# Patient Record
Sex: Female | Born: 1965 | Race: White | Hispanic: No | Smoking: Never smoker
Health system: Southern US, Community
[De-identification: ages and names within clinical notes are randomized; demographics above are authoritative.]

## PROBLEM LIST (undated history)

## (undated) DIAGNOSIS — R6883 Chills (without fever): Secondary | ICD-10-CM

## (undated) DIAGNOSIS — R61 Generalized hyperhidrosis: Secondary | ICD-10-CM

## (undated) DIAGNOSIS — R42 Dizziness and giddiness: Secondary | ICD-10-CM

## (undated) DIAGNOSIS — R319 Hematuria, unspecified: Secondary | ICD-10-CM

## (undated) DIAGNOSIS — E785 Hyperlipidemia, unspecified: Secondary | ICD-10-CM

## (undated) DIAGNOSIS — R630 Anorexia: Secondary | ICD-10-CM

## (undated) DIAGNOSIS — F419 Anxiety disorder, unspecified: Secondary | ICD-10-CM

## (undated) DIAGNOSIS — R251 Tremor, unspecified: Secondary | ICD-10-CM

## (undated) DIAGNOSIS — R0989 Other specified symptoms and signs involving the circulatory and respiratory systems: Secondary | ICD-10-CM

## (undated) DIAGNOSIS — F329 Major depressive disorder, single episode, unspecified: Secondary | ICD-10-CM

## (undated) DIAGNOSIS — R39198 Other difficulties with micturition: Secondary | ICD-10-CM

## (undated) DIAGNOSIS — K625 Hemorrhage of anus and rectum: Secondary | ICD-10-CM

## (undated) DIAGNOSIS — R52 Pain, unspecified: Secondary | ICD-10-CM

## (undated) DIAGNOSIS — F32A Depression, unspecified: Secondary | ICD-10-CM

## (undated) DIAGNOSIS — G479 Sleep disorder, unspecified: Secondary | ICD-10-CM

## (undated) DIAGNOSIS — G4730 Sleep apnea, unspecified: Secondary | ICD-10-CM

## (undated) DIAGNOSIS — K219 Gastro-esophageal reflux disease without esophagitis: Secondary | ICD-10-CM

## (undated) HISTORY — DX: Generalized hyperhidrosis: R61

## (undated) HISTORY — DX: Dizziness and giddiness: R42

## (undated) HISTORY — DX: Chills (without fever): R68.83

## (undated) HISTORY — PX: MOUTH SURGERY: SHX715

## (undated) HISTORY — DX: Other difficulties with micturition: R39.198

## (undated) HISTORY — DX: Pain, unspecified: R52

## (undated) HISTORY — PX: EYE SURGERY: SHX253

## (undated) HISTORY — DX: Depression, unspecified: F32.A

## (undated) HISTORY — DX: Hyperlipidemia, unspecified: E78.5

## (undated) HISTORY — DX: Hemorrhage of anus and rectum: K62.5

## (undated) HISTORY — DX: Other specified symptoms and signs involving the circulatory and respiratory systems: R09.89

## (undated) HISTORY — DX: Sleep disorder, unspecified: G47.9

## (undated) HISTORY — DX: Anxiety disorder, unspecified: F41.9

## (undated) HISTORY — DX: Anorexia: R63.0

## (undated) HISTORY — DX: Major depressive disorder, single episode, unspecified: F32.9

## (undated) HISTORY — DX: Hematuria, unspecified: R31.9

---

## 1999-05-14 ENCOUNTER — Other Ambulatory Visit: Admission: RE | Admit: 1999-05-14 | Discharge: 1999-05-14 | Payer: Self-pay | Admitting: Obstetrics and Gynecology

## 2000-06-16 ENCOUNTER — Other Ambulatory Visit: Admission: RE | Admit: 2000-06-16 | Discharge: 2000-06-16 | Payer: Self-pay | Admitting: Obstetrics and Gynecology

## 2001-12-28 ENCOUNTER — Encounter: Payer: Self-pay | Admitting: *Deleted

## 2001-12-28 ENCOUNTER — Ambulatory Visit (HOSPITAL_COMMUNITY): Admission: RE | Admit: 2001-12-28 | Discharge: 2001-12-28 | Payer: Self-pay | Admitting: *Deleted

## 2002-08-13 ENCOUNTER — Observation Stay (HOSPITAL_COMMUNITY): Admission: AD | Admit: 2002-08-13 | Discharge: 2002-08-13 | Payer: Self-pay | Admitting: Obstetrics and Gynecology

## 2003-02-06 ENCOUNTER — Inpatient Hospital Stay (HOSPITAL_COMMUNITY): Admission: AD | Admit: 2003-02-06 | Discharge: 2003-02-09 | Payer: Self-pay | Admitting: Obstetrics and Gynecology

## 2003-03-14 ENCOUNTER — Other Ambulatory Visit: Admission: RE | Admit: 2003-03-14 | Discharge: 2003-03-14 | Payer: Self-pay | Admitting: Obstetrics and Gynecology

## 2004-07-01 ENCOUNTER — Other Ambulatory Visit: Admission: RE | Admit: 2004-07-01 | Discharge: 2004-07-01 | Payer: Self-pay | Admitting: Obstetrics and Gynecology

## 2010-06-03 ENCOUNTER — Encounter: Admission: RE | Admit: 2010-06-03 | Discharge: 2010-06-03 | Payer: Self-pay | Admitting: Family Medicine

## 2011-05-12 ENCOUNTER — Encounter (INDEPENDENT_AMBULATORY_CARE_PROVIDER_SITE_OTHER): Payer: Self-pay | Admitting: Surgery

## 2011-05-12 ENCOUNTER — Ambulatory Visit (INDEPENDENT_AMBULATORY_CARE_PROVIDER_SITE_OTHER): Payer: BC Managed Care – PPO | Admitting: Surgery

## 2011-05-12 VITALS — BP 112/80 | HR 68 | Temp 98.6°F | Ht 67.0 in | Wt 122.0 lb

## 2011-05-12 DIAGNOSIS — K602 Anal fissure, unspecified: Secondary | ICD-10-CM

## 2011-05-12 NOTE — Patient Instructions (Signed)
Continue current treatment with sitz baths and diltiazem cream.  This should continue to get better without the need for surgery.

## 2011-05-12 NOTE — Progress Notes (Signed)
Chief Complaint  Patient presents with  . Other    new pt- eval rectal fissure    Sandra Kennedy is a 45 y.o. (DOB: 03-02-1966)  white female who is a patient of [name],[name], MD and comes to me today for anal pain/?fissure.  She saw Dr. Evette Cristal 05/05/2011 for the anal pain.  The patient first started having rectal pain and back pain on 26 April 2011. The began after her anal intercourse.  She sees Dr. Myna Bright as her primary medical doctor. Though she is a patient at [facility].  He started also rectal suppositories and some treatment of a urinary tract infection. Because of pain over the weekend she went to the urgent care at [facility], who changed her antibiotics.  Because of persistent symptoms she saw Dr. Wandalee Ferdinand on 05 May 2011. He started on her own diltiazem cream to her anus.  Over the last week she's had decreasing rectal pain, decreasing bleeding from rectum, improvement in her bowel habits.  Gastrointestinal history: She has no history of peptic ulcer disease, liver disease, pancreatic disease, or colon disease. She has no family history colon cancer, or colitis, or Crohn's disease. She's never had a colonoscopy.   Past Medical History  Diagnosis Date  . Hyperlipidemia   . Depression   . Anxiety   . Tinnitus   . Night sweats   . Chills   . Sleep difficulties   . Poor circulation   . Dizziness   . Pain     rectal  . Poor appetite   . Abdominal pain   . Rectal bleeding   . Blood in urine   . Difficulty urinating     Past Surgical History  Procedure Date  . Mouth surgery around the age of 37    Current Outpatient Prescriptions  Medication Sig Dispense Refill  . citalopram (CELEXA) 10 MG tablet daily.      Marland Kitchen DILTIAZEM HCL PO Take by mouth 4 (four) times daily.        . hydrocortisone (ANUSOL-HC) 25 MG suppository 4 times daily.      [name] 1/20 1-20 MG-MCG tablet daily.      Marland Kitchen oxycodone-acetaminophen (ROXICET) 5-500 MG per tablet Take 1 tablet by  mouth every 4 (four) hours as needed.          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  PHYSICAL EXAM: BP 112/80  Pulse 68  Temp(Src) 98.6 F (37 C) (Temporal)  Ht 5\' 7"  (1.702 m)  Wt 122 lb (55.339 kg)  BMI 19.11 kg/m2  Abdomen: She is thin. She has no abdominal scars, mass, or tenderness. Rectum: actually she has what looks like to anal fissures. One directly posterior and one his right lateral. Both show signs of healing. There is no abscess or evidence of infection.  DATA REVIEWED: Notes from Dr. Evette Cristal  ASSESSMENT and PLAN: 1.  Traumatic anal fissure.  This appears to be healing.  I gave her literature on anal fissures/rectal disease.  She'll continue her current treatment, which is going well.  Return to me on a prn basis.  She is written out of work until 05/19/2011.  That should be enough, but we can extend that if necessary.

## 2013-05-18 ENCOUNTER — Other Ambulatory Visit: Payer: Self-pay | Admitting: Neurology

## 2013-07-21 ENCOUNTER — Other Ambulatory Visit: Payer: Self-pay | Admitting: Neurology

## 2013-07-30 ENCOUNTER — Other Ambulatory Visit: Payer: Self-pay | Admitting: Family Medicine

## 2013-07-30 ENCOUNTER — Ambulatory Visit
Admission: RE | Admit: 2013-07-30 | Discharge: 2013-07-30 | Disposition: A | Discharge status: Home / Self Care | Payer: BC Managed Care – PPO | Source: Ambulatory Visit | Attending: Family Medicine | Admitting: Family Medicine

## 2013-07-30 DIAGNOSIS — R0781 Pleurodynia: Secondary | ICD-10-CM

## 2013-07-30 DIAGNOSIS — T148XXA Other injury of unspecified body region, initial encounter: Secondary | ICD-10-CM

## 2013-11-27 ENCOUNTER — Ambulatory Visit: Payer: Self-pay | Admitting: Neurology

## 2014-04-09 ENCOUNTER — Ambulatory Visit: Payer: Self-pay | Admitting: Neurology

## 2014-04-19 ENCOUNTER — Ambulatory Visit
Admission: RE | Admit: 2014-04-19 | Discharge: 2014-04-19 | Disposition: A | Discharge status: Home / Self Care | Payer: BC Managed Care – PPO | Source: Ambulatory Visit | Attending: Family Medicine | Admitting: Family Medicine

## 2014-04-19 ENCOUNTER — Other Ambulatory Visit: Payer: Self-pay | Admitting: Family Medicine

## 2014-04-19 DIAGNOSIS — R0789 Other chest pain: Secondary | ICD-10-CM

## 2014-05-31 ENCOUNTER — Ambulatory Visit: Payer: Self-pay | Admitting: Neurology

## 2014-06-11 ENCOUNTER — Ambulatory Visit: Payer: Self-pay | Admitting: Neurology

## 2014-07-23 ENCOUNTER — Institutional Professional Consult (permissible substitution): Payer: BC Managed Care – PPO | Admitting: Pulmonary Disease

## 2015-11-12 ENCOUNTER — Encounter (HOSPITAL_COMMUNITY): Payer: Self-pay

## 2015-11-12 ENCOUNTER — Emergency Department (HOSPITAL_COMMUNITY)
Admission: EM | Admit: 2015-11-12 | Discharge: 2015-11-12 | Disposition: A | Discharge status: Home / Self Care | Payer: BLUE CROSS/BLUE SHIELD | Attending: Emergency Medicine | Admitting: Emergency Medicine

## 2015-11-12 DIAGNOSIS — Z8719 Personal history of other diseases of the digestive system: Secondary | ICD-10-CM | POA: Diagnosis not present

## 2015-11-12 DIAGNOSIS — Z8669 Personal history of other diseases of the nervous system and sense organs: Secondary | ICD-10-CM | POA: Insufficient documentation

## 2015-11-12 DIAGNOSIS — Z7982 Long term (current) use of aspirin: Secondary | ICD-10-CM | POA: Insufficient documentation

## 2015-11-12 DIAGNOSIS — F419 Anxiety disorder, unspecified: Secondary | ICD-10-CM | POA: Insufficient documentation

## 2015-11-12 DIAGNOSIS — R509 Fever, unspecified: Secondary | ICD-10-CM | POA: Diagnosis present

## 2015-11-12 DIAGNOSIS — J111 Influenza due to unidentified influenza virus with other respiratory manifestations: Secondary | ICD-10-CM | POA: Diagnosis not present

## 2015-11-12 DIAGNOSIS — Z8679 Personal history of other diseases of the circulatory system: Secondary | ICD-10-CM | POA: Diagnosis not present

## 2015-11-12 DIAGNOSIS — Z79899 Other long term (current) drug therapy: Secondary | ICD-10-CM | POA: Insufficient documentation

## 2015-11-12 DIAGNOSIS — F329 Major depressive disorder, single episode, unspecified: Secondary | ICD-10-CM | POA: Diagnosis not present

## 2015-11-12 DIAGNOSIS — E785 Hyperlipidemia, unspecified: Secondary | ICD-10-CM | POA: Insufficient documentation

## 2015-11-12 LAB — CBC WITH DIFFERENTIAL/PLATELET
BASOS PCT: 0 %
Basophils Absolute: 0 10*3/uL (ref 0.0–0.1)
EOS ABS: 0 10*3/uL (ref 0.0–0.7)
Eosinophils Relative: 0 %
HEMATOCRIT: 44 % (ref 36.0–46.0)
HEMOGLOBIN: 14.6 g/dL (ref 12.0–15.0)
LYMPHS ABS: 0.8 10*3/uL (ref 0.7–4.0)
Lymphocytes Relative: 18 %
MCH: 28.4 pg (ref 26.0–34.0)
MCHC: 33.2 g/dL (ref 30.0–36.0)
MCV: 85.6 fL (ref 78.0–100.0)
Monocytes Absolute: 0.7 10*3/uL (ref 0.1–1.0)
Monocytes Relative: 16 %
NEUTROS ABS: 3 10*3/uL (ref 1.7–7.7)
NEUTROS PCT: 66 %
Platelets: 203 10*3/uL (ref 150–400)
RBC: 5.14 MIL/uL — AB (ref 3.87–5.11)
RDW: 13.1 % (ref 11.5–15.5)
WBC: 4.6 10*3/uL (ref 4.0–10.5)

## 2015-11-12 LAB — BASIC METABOLIC PANEL
ANION GAP: 14 (ref 5–15)
BUN: 13 mg/dL (ref 6–20)
CHLORIDE: 106 mmol/L (ref 101–111)
CO2: 23 mmol/L (ref 22–32)
CREATININE: 0.96 mg/dL (ref 0.44–1.00)
Calcium: 9.2 mg/dL (ref 8.9–10.3)
GFR calc non Af Amer: >60 mL/min (ref >60)
Glucose, Bld: 95 mg/dL (ref 65–99)
Potassium: 3.5 mmol/L (ref 3.5–5.1)
SODIUM: 143 mmol/L (ref 135–145)

## 2015-11-12 MED ORDER — KETOROLAC TROMETHAMINE 30 MG/ML IJ SOLN
30.0000 mg | Freq: Once | INTRAMUSCULAR | Status: AC
  Administered 2015-11-12: 30 mg via INTRAVENOUS
  Filled 2015-11-12: qty 1

## 2015-11-12 MED ORDER — SODIUM CHLORIDE 0.9 % IV BOLUS (SEPSIS)
1000.0000 mL | Freq: Once | INTRAVENOUS | Status: AC
  Administered 2015-11-12: 1000 mL via INTRAVENOUS

## 2015-11-12 NOTE — ED Provider Notes (Signed)
CSN: [id number]     Arrival date & time 11/12/15  1307 History   First MD Initiated Contact with Patient 11/12/15 1548     Chief Complaint  Patient presents with  . Fever  . Influenza     (Consider location/radiation/quality/duration/timing/severity/associated sxs/prior Treatment) HPI Comments: Patient is a 50 year old female with little medical history. She presents for evaluation of fever, cough, body aches for the past several days. She was started on Tamiflu by her primary doctor 2 days ago, however said difficulty keeping liquids down. She went to the doctor's office today and tested positive for influenza B. She was referred here for evaluation of possible dehydration, IV fluids, and further evaluation.  Patient is a 50 y.o. female presenting with fever. The history is provided by the patient.  Fever Temp source:  Subjective Severity:  Moderate Onset quality:  Sudden Duration:  3 days Timing:  Constant Progression:  Worsening Chronicity:  New Relieved by:  Nothing Worsened by:  Nothing tried Ineffective treatments:  None tried   Past Medical History  Diagnosis Date  . Hyperlipidemia   . Depression   . Anxiety   . Tinnitus   . Night sweats   . Chills   . Sleep difficulties   . Poor circulation   . Dizziness   . Pain     rectal  . Poor appetite   . Abdominal pain   . Rectal bleeding   . Blood in urine   . Difficulty urinating    Past Surgical History  Procedure Laterality Date  . Mouth surgery  around the age of 73   Family History  Problem Relation Age of Onset  . Diabetes Mother   . Diabetes Father   . Heart disease Father    Social History  Substance Use Topics  . Smoking status: Never Smoker   . Smokeless tobacco: Never Used  . Alcohol Use: No   OB History    No data available     Review of Systems  Constitutional: Positive for fever.  All other systems reviewed and are negative.     Allergies  Review of patient's allergies indicates  no known allergies.  Home Medications   Prior to Admission medications   Medication Sig Start Date End Date Taking? Authorizing Provider  aspirin 81 MG chewable tablet Chew 81 mg by mouth daily.   Yes Historical Provider, MD  Cholecalciferol (VITAMIN D PO) Take by mouth.   Yes Historical Provider, MD  citalopram (CELEXA) 10 MG tablet daily. 04/30/11  Yes Historical Provider, MD  Dextromethorphan-Menthol (DELSYM COUGH RELIEF MT) Use as directed in the mouth or throat as directed.   Yes Historical Provider, MD  ibuprofen (ADVIL,MOTRIN) 200 MG tablet Take 200 mg by mouth every 6 (six) hours as needed for fever or mild pain.   Yes Historical Provider, MD  levonorgestrel (MIRENA) 20 MCG/24HR IUD 1 each by Intrauterine route once.   Yes Historical Provider, MD  oseltamivir (TAMIFLU) 75 MG capsule Take 75 mg by mouth 2 (two) times daily. 11/10/15  Yes Historical Provider, MD  simvastatin (ZOCOR) 20 MG tablet Take 20 mg by mouth every evening. 11/01/15  Yes Historical Provider, MD   BP 145/89 mmHg  Pulse 101  Temp(Src) 99.8 F (37.7 C) (Oral)  Resp 20  SpO2 100% Physical Exam  Constitutional: She is oriented to person, place, and time. She appears well-developed and well-nourished. No distress.  HENT:  Head: Normocephalic and atraumatic.  Mucous membranes somewhat dry.  TMs clear  bilaterally.  Neck: Normal range of motion. Neck supple.  Cardiovascular: Normal rate and regular rhythm.  Exam reveals no gallop and no friction rub.   No murmur heard. Pulmonary/Chest: Effort normal and breath sounds normal. No respiratory distress. She has no wheezes.  Abdominal: Soft. Bowel sounds are normal. She exhibits no distension. There is no tenderness.  Musculoskeletal: Normal range of motion.  Neurological: She is alert and oriented to person, place, and time.  Skin: Skin is warm and dry. She is not diaphoretic.  Nursing note and vitals reviewed.   ED Course  Procedures (including critical care  time) Labs Review Labs Reviewed  BASIC METABOLIC PANEL  CBC WITH DIFFERENTIAL/PLATELET    Imaging Review No results found. I have personally reviewed and evaluated these images and lab results as part of my medical decision-making.   EKG Interpretation None      MDM   Final diagnoses:  None    Patient recently diagnosed with influenza. She continues to feel bad despite taking Tamiflu. Had difficulty eating and drinking and was sent here by her doctor for IV fluids. She was given normal saline in the ER. Her laboratory studies are essentially normal. She is to return as needed if she develops difficulty breathing or other problems.    Geoffery Lyons, MD 11/12/15 [id number]

## 2015-11-12 NOTE — ED Notes (Signed)
Pt states "I've taken 4 dose of Tamiflu.  I called the doctor's office earlier and they gave it to me.  I went to the doctor today and tested (+) for influenza B"

## 2015-11-12 NOTE — Discharge Instructions (Signed)
Continue your medications as before.  Replace fluids and get plenty of rest.  Tylenol 1000 mg rotated with Motrin 600 mg every 6 hours as needed for fever or pain.   Influenza, Adult Influenza ("the flu") is a viral infection of the respiratory tract. It occurs more often in winter months because people spend more time in close contact with one another. Influenza can make you feel very sick. Influenza easily spreads from person to person (contagious). CAUSES  Influenza is caused by a virus that infects the respiratory tract. You can catch the virus by breathing in droplets from an infected person's cough or sneeze. You can also catch the virus by touching something that was recently contaminated with the virus and then touching your mouth, nose, or eyes. RISKS AND COMPLICATIONS You may be at risk for a more severe case of influenza if you smoke cigarettes, have diabetes, have chronic heart disease (such as heart failure) or lung disease (such as asthma), or if you have a weakened immune system. Elderly people and pregnant women are also at risk for more serious infections. The most common problem of influenza is a lung infection (pneumonia). Sometimes, this problem can require emergency medical care and may be life threatening. SIGNS AND SYMPTOMS  Symptoms typically last 4 to 10 days and may include:  Fever.  Chills.  Headache, body aches, and muscle aches.  Sore throat.  Chest discomfort and cough.  Poor appetite.  Weakness or feeling tired.  Dizziness.  Nausea or vomiting. DIAGNOSIS  Diagnosis of influenza is often made based on your history and a physical exam. A nose or throat swab test can be done to confirm the diagnosis. TREATMENT  In mild cases, influenza goes away on its own. Treatment is directed at relieving symptoms. For more severe cases, your health care provider may prescribe antiviral medicines to shorten the sickness. Antibiotic medicines are not effective because  the infection is caused by a virus, not by bacteria. HOME CARE INSTRUCTIONS  Take medicines only as directed by your health care provider.  Use a cool mist humidifier to make breathing easier.  Get plenty of rest until your temperature returns to normal. This usually takes 3 to 4 days.  Drink enough fluid to keep your urine clear or pale yellow.  Cover yourmouth and nosewhen coughing or sneezing,and wash your handswellto prevent thevirusfrom spreading.  Stay homefromwork orschool untilthe fever is gonefor at least 67full day. PREVENTION  An annual influenza vaccination (flu shot) is the best way to avoid getting influenza. An annual flu shot is now routinely recommended for all adults in the [place] SEEK MEDICAL CARE IF:  You experiencechest pain, yourcough worsens,or you producemore mucus.  Youhave nausea,vomiting, ordiarrhea.  Your fever returns or gets worse. SEEK IMMEDIATE MEDICAL CARE IF:  You havetrouble breathing, you become short of breath,or your skin ornails becomebluish.  You have severe painor stiffnessin the neck.  You develop a sudden headache, or pain in the face or ear.  You have nausea or vomiting that you cannot control. MAKE SURE YOU:   Understand these instructions.  Will watch your condition.  Will get help right away if you are not doing well or get worse.   This information is not intended to replace advice given to you by your health care provider. Make sure you discuss any questions you have with your health care provider.   Document Released: 09/03/2000 Document Revised: 09/27/2014 Document Reviewed: 12/06/2011 Elsevier Interactive Patient Education [employer]

## 2015-11-12 NOTE — ED Notes (Signed)
Per EMS, pt from MD office.  Pt has had fever since Monday.  Nausea with dizziness.  Labs done at MD office.  ? Dehydration per MD office.  Vitals 140/90, hr 97, resp 18, fever 101

## 2016-04-12 DIAGNOSIS — Z01419 Encounter for gynecological examination (general) (routine) without abnormal findings: Secondary | ICD-10-CM | POA: Diagnosis not present

## 2016-04-12 DIAGNOSIS — Z1231 Encounter for screening mammogram for malignant neoplasm of breast: Secondary | ICD-10-CM | POA: Diagnosis not present

## 2016-04-12 DIAGNOSIS — Z6825 Body mass index (BMI) 25.0-25.9, adult: Secondary | ICD-10-CM | POA: Diagnosis not present

## 2016-04-30 NOTE — Discharge Instructions (Signed)
INSTRUCTIONS FOLLOWING OCULOPLASTIC SURGERY °AMY M. FOWLER, MD ° °AFTER YOUR EYE SURGERY, THER ARE MANY THINGS THWIHC YOU, THE PATIENT, CAN DO TO ASSURE THE BEST POSSIBLE RESULT FROM YOUR OPERATION.  THIS SHEET SHOULD BE REFERRED TO WHENEVER QUESTIONS ARISE.  IF THERE ARE ANY QUESTIONS NOT ANSWERED HERE, DO NOT HESITATE TO CALL OUR OFFICE AT [phone] OR [phone].  THERE IS ALWAYS [facility] AVAILABLE TO CALL IF QUESTIONS OR PROBLEMS ARISE. ° °VISION: Your vision may be blurred and out of focus after surgery until you are able to stop using your ointment, swelling resolves and your eye(s) heal. This may take 1 to 2 weeks at the least.  If your vision becomes gradually more dim or dark, this is not normal and you need to call our office immediately. ° °EYE CARE: For the first 48 hours after surgery, use ice packs frequently - “20 minutes on, 20 minutes off” - to help reduce swelling and bruising.  Small bags of frozen peas or corn make good ice packs along with cloths soaked in ice water.  If you are wearing a patch or other type of dressing following surgery, keep this on for the amount of time specified by your doctor.  For the first week following surgery, you will need to treat your stitches with great care.  If is OK to shower, but take care to not allow soapy water to run into your eye(s) to help reduce changes of infection.  You may gently clean the eyelashes and around the eye(s) with cotton balls and sterile water, BUT DO NOT RUB THE STITCHES VIGOROUSLY.  Keeping your stitches moist with ointment will help promote healing with minimal scar formation. ° °ACTIVITY: When you leave the surgery center, you should go home, rest and be inactive.  The eye(s) may feel scratchy and keeping the eyes closed will allow for faster healing.  The first week following surgery, avoid straining (anything making the face turn red) or lifting over 20 pounds.  Additionally, avoid bending which causes your head to go below  your waist.  Using your eyes will NOT harm them, so feel free to read, watch television, use the computer, etc as desired.  Driving depends on each individual, so check with your doctor if you have questions about driving. ° °MEDICATIONS:  You will be given a prescription for an ointment to use 4 times a day on your stitches.  You can use the ointment in your eyes if they feel scratchy or irritated.  If you eyelid(s) don’t close completely when you sleep, put some ointment in your eyes before bedtime. ° °EMERGENCY: If you experience SEVERE EYE PAIN OR HEADACHE UNRELIEVED BY TYLENOL OR PERCOCET, NAUSEA OR VOMITING, WORSENING REDNESS, OR WORSENING VISION (ESPECIALLY VISION THAT WA INITIALLY BETTER) CALL [phone] OR [phone] DURING BUSINESS HOURS OR AFTER HOURS. ° °General Anesthesia, Adult, Care After °Refer to this sheet in the next few weeks. These instructions provide you with information on caring for yourself after your procedure. Your health care provider may also give you more specific instructions. Your treatment has been planned according to current medical practices, but problems sometimes occur. Call your health care provider if you have any problems or questions after your procedure. °WHAT TO EXPECT AFTER THE PROCEDURE °After the procedure, it is typical to experience: °· Sleepiness. °· Nausea and vomiting. °HOME CARE INSTRUCTIONS °· For the first 24 hours after general anesthesia: °¨ Have a responsible person with you. °¨ Do not drive a car. If you   are alone, do not take public transportation. °¨ Do not drink alcohol. °¨ Do not take medicine that has not been prescribed by your health care provider. °¨ Do not sign important papers or make important decisions. °¨ You may resume a normal diet and activities as directed by your health care provider. °· Change bandages (dressings) as directed. °· If you have questions or problems that seem related to general anesthesia, call the hospital and ask for  the anesthetist or anesthesiologist on call. °SEEK MEDICAL CARE IF: °· You have nausea and vomiting that continue the day after anesthesia. °· You develop a rash. °SEEK IMMEDIATE MEDICAL CARE IF:  °· You have difficulty breathing. °· You have chest pain. °· You have any allergic problems. °  °This information is not intended to replace advice given to you by your health care provider. Make sure you discuss any questions you have with your health care provider. °  °Document Released: 12/13/2000 Document Revised: 09/27/2014 Document Reviewed: 01/05/2012 °Elsevier Interactive Patient Education ©2016 Elsevier Inc. ° °

## 2016-05-04 ENCOUNTER — Ambulatory Visit: Payer: BLUE CROSS/BLUE SHIELD | Admitting: Anesthesiology

## 2016-05-04 ENCOUNTER — Encounter
Admission: RE | Disposition: A | Discharge status: Home / Self Care | Payer: Self-pay | Source: Ambulatory Visit | Attending: Ophthalmology

## 2016-05-04 ENCOUNTER — Ambulatory Visit
Admission: RE | Admit: 2016-05-04 | Discharge: 2016-05-04 | Disposition: A | Discharge status: Home / Self Care | Payer: BLUE CROSS/BLUE SHIELD | Source: Ambulatory Visit | Attending: Ophthalmology | Admitting: Ophthalmology

## 2016-05-04 ENCOUNTER — Encounter: Payer: Self-pay | Admitting: Anesthesiology

## 2016-05-04 DIAGNOSIS — I739 Peripheral vascular disease, unspecified: Secondary | ICD-10-CM | POA: Insufficient documentation

## 2016-05-04 DIAGNOSIS — E7800 Pure hypercholesterolemia, unspecified: Secondary | ICD-10-CM | POA: Diagnosis not present

## 2016-05-04 DIAGNOSIS — F419 Anxiety disorder, unspecified: Secondary | ICD-10-CM | POA: Insufficient documentation

## 2016-05-04 DIAGNOSIS — H02832 Dermatochalasis of right lower eyelid: Secondary | ICD-10-CM | POA: Insufficient documentation

## 2016-05-04 DIAGNOSIS — H02835 Dermatochalasis of left lower eyelid: Secondary | ICD-10-CM | POA: Diagnosis not present

## 2016-05-04 DIAGNOSIS — H02105 Unspecified ectropion of left lower eyelid: Secondary | ICD-10-CM | POA: Insufficient documentation

## 2016-05-04 DIAGNOSIS — H9190 Unspecified hearing loss, unspecified ear: Secondary | ICD-10-CM | POA: Diagnosis not present

## 2016-05-04 DIAGNOSIS — H02102 Unspecified ectropion of right lower eyelid: Secondary | ICD-10-CM | POA: Diagnosis not present

## 2016-05-04 HISTORY — DX: Gastro-esophageal reflux disease without esophagitis: K21.9

## 2016-05-04 HISTORY — PX: ECTROPION REPAIR: SHX357

## 2016-05-04 SURGERY — REPAIR, ECTROPION, EYELID
Anesthesia: Monitor Anesthesia Care | Laterality: Bilateral | Wound class: Clean

## 2016-05-04 MED ORDER — OXYCODONE-ACETAMINOPHEN 5-325 MG PO TABS: 1.0000 | ORAL_TABLET | ORAL | 0 refills | Status: DC | PRN

## 2016-05-04 MED ORDER — LIDOCAINE HCL (CARDIAC) 20 MG/ML IV SOLN
INTRAVENOUS | Status: DC | PRN
  Administered 2016-05-04: 40 mg via INTRAVENOUS

## 2016-05-04 MED ORDER — PROPOFOL 500 MG/50ML IV EMUL
INTRAVENOUS | Status: DC | PRN
  Administered 2016-05-04: 50 ug/kg/min via INTRAVENOUS

## 2016-05-04 MED ORDER — ERYTHROMYCIN 5 MG/GM OP OINT: TOPICAL_OINTMENT | OPHTHALMIC | 3 refills | Status: DC

## 2016-05-04 MED ORDER — TETRACAINE HCL 0.5 % OP SOLN
OPHTHALMIC | Status: DC | PRN
  Administered 2016-05-04: 2 [drp] via OPHTHALMIC

## 2016-05-04 MED ORDER — ALFENTANIL 500 MCG/ML IJ INJ
INJECTION | INTRAMUSCULAR | Status: DC | PRN
  Administered 2016-05-04: 1000 ug via INTRAVENOUS

## 2016-05-04 MED ORDER — ERYTHROMYCIN 5 MG/GM OP OINT
TOPICAL_OINTMENT | OPHTHALMIC | Status: DC | PRN
  Administered 2016-05-04: 1 via OPHTHALMIC

## 2016-05-04 MED ORDER — LIDOCAINE-EPINEPHRINE 2 %-1:100000 IJ SOLN
INTRAMUSCULAR | Status: DC | PRN
  Administered 2016-05-04: 4 mL via OPHTHALMIC

## 2016-05-04 MED ORDER — MIDAZOLAM HCL 2 MG/2ML IJ SOLN
INTRAMUSCULAR | Status: DC | PRN
  Administered 2016-05-04: 2 mg via INTRAVENOUS

## 2016-05-04 MED ORDER — LACTATED RINGERS IV SOLN
INTRAVENOUS | Status: DC
  Administered 2016-05-04: 10:00:00 via INTRAVENOUS

## 2016-05-04 SURGICAL SUPPLY — 39 items
APPLICATOR COTTON TIP WD 3 STR (MISCELLANEOUS) ×6 IMPLANT
BLADE SURG 15 STRL LF DISP TIS (BLADE) ×1 IMPLANT
BLADE SURG 15 STRL SS (BLADE) ×3
CAUTERY HITEMP AA01 (MISCELLANEOUS) ×2 IMPLANT
CORD BIP STRL DISP 12FT (MISCELLANEOUS) ×3 IMPLANT
DRAPE HEAD BAR (DRAPES) ×3 IMPLANT
GAUZE SPONGE 4X4 12PLY STRL (GAUZE/BANDAGES/DRESSINGS) ×3 IMPLANT
GAUZE SPONGE NON-WVN 2X2 STRL (MISCELLANEOUS) ×10 IMPLANT
GLOVE SURG LX 7.0 MICRO (GLOVE) ×4
GLOVE SURG LX STRL 7.0 MICRO (GLOVE) ×2 IMPLANT
MARKER SKIN XFINE TIP W/RULER (MISCELLANEOUS) ×3 IMPLANT
NDL FILTER BLUNT 18X1 1/2 (NEEDLE) ×1 IMPLANT
NDL HYPO 30X.5 LL (NEEDLE) ×2 IMPLANT
NEEDLE FILTER BLUNT 18X 1/2SAF (NEEDLE) ×2
NEEDLE FILTER BLUNT 18X1 1/2 (NEEDLE) ×1 IMPLANT
NEEDLE HYPO 30X.5 LL (NEEDLE) ×6 IMPLANT
PACK DRAPE NASAL/ENT (PACKS) ×3 IMPLANT
SOL PREP PVP 2OZ (MISCELLANEOUS) ×3
SOLUTION PREP PVP 2OZ (MISCELLANEOUS) ×1 IMPLANT
SPONGE VERSALON 2X2 STRL (MISCELLANEOUS) ×30
SUT CHROMIC 4-0 (SUTURE)
SUT CHROMIC 4-0 M2 12X2 ARM (SUTURE)
SUT CHROMIC 5 0 P 3 (SUTURE) IMPLANT
SUT ETHILON 4 0 CL P 3 (SUTURE) IMPLANT
SUT MERSILENE 4-0 S-2 (SUTURE) ×5 IMPLANT
SUT PDS AB 4-0 P3 18 (SUTURE) IMPLANT
SUT PLAIN GUT (SUTURE) ×3 IMPLANT
SUT PROLENE 5 0 P 3 (SUTURE) IMPLANT
SUT PROLENE 6 0 P 1 18 (SUTURE) IMPLANT
SUT SILK 4 0 G 3 (SUTURE) IMPLANT
SUT VIC AB 5-0 P-3 18X BRD (SUTURE) IMPLANT
SUT VIC AB 5-0 P3 18 (SUTURE)
SUT VICRYL 6-0  S14 CTD (SUTURE)
SUT VICRYL 6-0 S14 CTD (SUTURE) IMPLANT
SUT VICRYL 7 0 TG140 8 (SUTURE) IMPLANT
SUTURE CHRMC 4-0 M2 12X2 ARM (SUTURE) IMPLANT
SYR 3ML LL SCALE MARK (SYRINGE) ×5 IMPLANT
SYRINGE 10CC LL (SYRINGE) ×3 IMPLANT
WATER STERILE IRR 500ML POUR (IV SOLUTION) ×3 IMPLANT

## 2016-05-04 NOTE — Interval H&P Note (Signed)
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 After consideration of risks, benefits and other options for treatment, the patient has consented to  Procedure(s): REPAIR OF ECTROPION WITH CONJUNCTIVOPLASTY (Bilateral) as a surgical intervention .  The patient's history has been reviewed, patient examined, no change in status, stable for surgery.  I have reviewed the patient's chart and labs.  Questions were answered to the patient's satisfaction.     Ether GriffinsFowler, Rashay Barnette M

## 2016-05-04 NOTE — Anesthesia Procedure Notes (Signed)
Procedure Name: MAC Performed by: Ajahnae Rathgeber Pre-anesthesia Checklist: Patient identified, Emergency Drugs available, Suction available, Timeout performed and Patient being monitored Patient Re-evaluated:Patient Re-evaluated prior to inductionOxygen Delivery Method: Nasal cannula Placement Confirmation: positive ETCO2     

## 2016-05-04 NOTE — H&P (Signed)
  See the history and physical completed at [facility] on 04/21/16 and scanned into the chart.  

## 2016-05-04 NOTE — Transfer of Care (Signed)
Immediate Anesthesia Transfer of Care Note  Patient: Sandra RothmanSherri S Kennedy  Procedure(s) Performed: Procedure(s): REPAIR OF ECTROPION WITH CONJUNCTIVOPLASTY (Bilateral)  Patient Location: PACU  Anesthesia Type: MAC  Level of Consciousness: awake, alert  and patient cooperative  Airway and Oxygen Therapy: Patient Spontanous Breathing and Patient connected to supplemental oxygen  Post-op Assessment: Post-op Vital signs reviewed, Patient's Cardiovascular Status Stable, Respiratory Function Stable, Patent Airway and No signs of Nausea or vomiting  Post-op Vital Signs: Reviewed and stable  Complications: No apparent anesthesia complications

## 2016-05-04 NOTE — Op Note (Signed)
Preoperative Diagnosis:   1.  Lower eyelid laxity with ectropion, bilateral  lower eyelid(s). 2.  Bilateral conjunctival chalasis   Postoperative Diagnosis:   Same.  Procedure(s) Performed:  1.  Lateral tarsal strip procedure,  both  lower eyelid(s). 2.  Bilateral conjunctivoplasty   Teaching Surgeon: Hubbard RobinsonAmy M. Ether GriffinsFowler, M.D.  Assistants: none  Anesthesia: MAC  Specimens: None.  Estimated Blood Loss: Minimal.  Complications: None.  Operative Findings: None   Procedure:    Review of patient's allergies indicates no known allergies..    After discussing the risks, benefits, complications, and alternatives with the patient, appropriate informed consent was obtained. The patient was brought to the operating suite and reclined supine. Time out was conducted and the patient was sedated.  Local anesthetic consisting of a 50-50 mixture of 2% lidocaine with epinephrine and 0.75% bupivacaine was injected subcutaneously to the both lateral canthal region(s) and lower eyelid(s). Additional anesthetic was injected subconjunctivally to the bilateral lower eyelid(s). Finally, anesthetic was injected down to the periosteum of the bilateral lateral orbital rim(s).  After adequate local was instilled, the patient was prepped and draped in the usual sterile fashion for eyelid surgery.   A punctal dilator was used to dilate all 4 puncta I. The upper lid puncta I were very stenotic. A #1 Bowman probe was then passed through the upper and lower canalicular system without difficulty. Fluorescein solution was injected through the lower canaliculas and retrieved under the inferior turbinate with a cotton swab on both sides which demonstrated a degree of patency of the nasolacrimal ducts. There was reflux with the irrigation of the fluorescein fluid left side worse than right which may indicate a partial nasolacrimal duct obstruction  bilaterally.  Attention was turned to the left lateral canthal angle.  Westcott scissors were used to create a lateral canthotomy. Hemostasis was obtained with bipolar cautery. An inferior cantholysis was then performed with additional bipolar hemostasis. The anterior and posterior lamella of the lid were divided for approximately 8 mm.  A strip of the epithelium was excised off the superior margin of the tarsal strip and conjunctiva and retractors were incised off the inferior margin of the tarsal strip. Additional local anesthetic was injected subconjunctivally to the medial bulbar conjunctiva to elevated. A high-temperature hand-held cautery was used to create multiple thermal burns in the medial bulbar conjunctiva to contract the tissue.  A double-armed 4-0 Mersilene suture was then passed each arm through the terminal portion of the tarsal strip. Each arm of the suture was then passed through the periosteum of the inner portion of the lateral orbital rim at the level of Whitnall's tubercle. The sutures were advanced and this provided nice elevation and tightening of the lower eyelid. Once the suture was secured, a thin strip of follicle-bearing skin was excised. The lateral canthal angle was reformed with an interrupted 6-0 fast absorbing plain suture. Orbicularis was reapproximated with horizontal subcuticular 6-0 fast absorbing plain gut sutures. The skin was closed with interrupted 6-0 fast absorbing plain gut sutures.   Attention was then turned to the opposite eyelid where the same procedure was performed in the same manner.   The patient tolerated the procedure well. Erythromycin ophthalmic ointment was applied to the incision site(s) followed by ice packs. The patient was taken to the recovery area where she recovered without difficulty.  Post-Op Plan/Instructions:  The patient was instructed to use ice packs frequently for the next 48 hours. She was instructed to use erythromycin ophthalmic ointment on her incisions  4 times a day for the next 12 to 14 days. She  was given a prescription for Percocet for pain control should Tylenol not be effective. She was asked to to follow up at the [facility] [place] in 2 weeks' time or sooner as needed for problems.  Teaching Surgeon Attestation: None  Sandra Kennedy M. Ether GriffinsFowler, M.D. Attending,Ophthalmology

## 2016-05-04 NOTE — Anesthesia Postprocedure Evaluation (Signed)
Anesthesia Post Note  Patient: Sandra RothmanSherri S Lachapelle  Procedure(s) Performed: Procedure(s) (LRB): REPAIR OF ECTROPION WITH CONJUNCTIVOPLASTY (Bilateral)  Patient location during evaluation: PACU Anesthesia Type: General Level of consciousness: awake and alert Pain management: pain level controlled Vital Signs Assessment: post-procedure vital signs reviewed and stable Respiratory status: spontaneous breathing, nonlabored ventilation, respiratory function stable and patient connected to nasal cannula oxygen Cardiovascular status: blood pressure returned to baseline and stable Postop Assessment: no signs of nausea or vomiting Anesthetic complications: no    Dorene GrebeMcCulloch, Katrece Roediger V

## 2016-05-04 NOTE — Anesthesia Preprocedure Evaluation (Signed)
Anesthesia Evaluation  Patient identified by MRN, date of birth, ID band Patient awake    Airway Mallampati: II  TM Distance: >3 FB Neck ROM: Full    Dental   Pulmonary    Pulmonary exam normal        Cardiovascular + Peripheral Vascular Disease  Normal cardiovascular exam     Neuro/Psych Anxiety Depression    GI/Hepatic   Endo/Other    Renal/GU      Musculoskeletal   Abdominal   Peds  Hematology   Anesthesia Other Findings   Reproductive/Obstetrics                             Anesthesia Physical Anesthesia Plan  ASA: II  Anesthesia Plan: MAC   Post-op Pain Management:    Induction: Intravenous  Airway Management Planned:   Additional Equipment:   Intra-op Plan:   Post-operative Plan:   Informed Consent: I have reviewed the patients History and Physical, chart, labs and discussed the procedure including the risks, benefits and alternatives for the proposed anesthesia with the patient or authorized representative who has indicated his/her understanding and acceptance.     Plan Discussed with: CRNA  Anesthesia Plan Comments:         Anesthesia Quick Evaluation

## 2016-05-05 ENCOUNTER — Encounter: Payer: Self-pay | Admitting: Ophthalmology

## 2016-07-21 DIAGNOSIS — H524 Presbyopia: Secondary | ICD-10-CM | POA: Diagnosis not present

## 2017-04-13 DIAGNOSIS — Z01419 Encounter for gynecological examination (general) (routine) without abnormal findings: Secondary | ICD-10-CM | POA: Diagnosis not present

## 2017-04-13 DIAGNOSIS — Z01411 Encounter for gynecological examination (general) (routine) with abnormal findings: Secondary | ICD-10-CM | POA: Diagnosis not present

## 2017-04-13 DIAGNOSIS — Z1231 Encounter for screening mammogram for malignant neoplasm of breast: Secondary | ICD-10-CM | POA: Diagnosis not present

## 2017-04-13 DIAGNOSIS — Z13220 Encounter for screening for lipoid disorders: Secondary | ICD-10-CM | POA: Diagnosis not present

## 2017-04-13 DIAGNOSIS — Z6826 Body mass index (BMI) 26.0-26.9, adult: Secondary | ICD-10-CM | POA: Diagnosis not present

## 2017-04-13 DIAGNOSIS — F419 Anxiety disorder, unspecified: Secondary | ICD-10-CM | POA: Diagnosis not present

## 2017-04-13 DIAGNOSIS — Z13228 Encounter for screening for other metabolic disorders: Secondary | ICD-10-CM | POA: Diagnosis not present

## 2017-04-15 ENCOUNTER — Other Ambulatory Visit: Payer: Self-pay | Admitting: Obstetrics and Gynecology

## 2017-04-15 DIAGNOSIS — R928 Other abnormal and inconclusive findings on diagnostic imaging of breast: Secondary | ICD-10-CM

## 2017-04-21 ENCOUNTER — Ambulatory Visit
Admission: RE | Admit: 2017-04-21 | Discharge: 2017-04-21 | Disposition: A | Discharge status: Home / Self Care | Payer: BLUE CROSS/BLUE SHIELD | Source: Ambulatory Visit | Attending: Obstetrics and Gynecology | Admitting: Obstetrics and Gynecology

## 2017-04-21 ENCOUNTER — Ambulatory Visit: Payer: BLUE CROSS/BLUE SHIELD

## 2017-04-21 DIAGNOSIS — R928 Other abnormal and inconclusive findings on diagnostic imaging of breast: Secondary | ICD-10-CM

## 2017-05-12 DIAGNOSIS — H9313 Tinnitus, bilateral: Secondary | ICD-10-CM | POA: Diagnosis not present

## 2017-05-12 DIAGNOSIS — H6123 Impacted cerumen, bilateral: Secondary | ICD-10-CM | POA: Diagnosis not present

## 2017-05-12 DIAGNOSIS — J343 Hypertrophy of nasal turbinates: Secondary | ICD-10-CM | POA: Diagnosis not present

## 2017-05-12 DIAGNOSIS — R0683 Snoring: Secondary | ICD-10-CM | POA: Diagnosis not present

## 2017-05-17 ENCOUNTER — Other Ambulatory Visit (HOSPITAL_BASED_OUTPATIENT_CLINIC_OR_DEPARTMENT_OTHER): Payer: Self-pay

## 2017-05-17 DIAGNOSIS — G4730 Sleep apnea, unspecified: Secondary | ICD-10-CM

## 2017-05-17 DIAGNOSIS — R0683 Snoring: Secondary | ICD-10-CM

## 2017-05-30 ENCOUNTER — Ambulatory Visit (HOSPITAL_BASED_OUTPATIENT_CLINIC_OR_DEPARTMENT_OTHER): Payer: BLUE CROSS/BLUE SHIELD | Attending: Otolaryngology | Admitting: Internal Medicine

## 2017-05-30 DIAGNOSIS — G4730 Sleep apnea, unspecified: Secondary | ICD-10-CM | POA: Diagnosis not present

## 2017-05-30 DIAGNOSIS — R0683 Snoring: Secondary | ICD-10-CM | POA: Insufficient documentation

## 2017-06-04 DIAGNOSIS — R0683 Snoring: Secondary | ICD-10-CM

## 2017-06-04 NOTE — Procedures (Signed)
  Patient Name: Sandra Kennedy, Sandra Kennedy Date: 05/31/2017 Gender: Female D.O.B: [birth year] Age (years): 51 Referring Provider: Christia Reading Height (inches): 67 Interpreting Physician: Jetty Duhamel MD, ABSM Weight (lbs): 157 RPSGT: Vinton Sink BMI: 25 MRN: [id number] Neck Size: 14.00 CLINICAL INFORMATION Sleep Study Type: HST  Indication for sleep study: Snoring  Epworth Sleepiness Score: 6  SLEEP STUDY TECHNIQUE A multi-channel overnight portable sleep study was performed. The channels recorded were: nasal airflow, thoracic respiratory movement, and oxygen saturation with a pulse oximetry. Snoring was also monitored.  MEDICATIONS Patient self administered medications include: none reported.  SLEEP ARCHITECTURE Patient was studied for 393.0 minutes. The sleep efficiency was 100.0 % and the patient was supine for 70.1%. The arousal index was 0.0 per hour.  RESPIRATORY PARAMETERS The overall AHI was 56.9 per hour, with a central apnea index of 0.0 per hour.  The oxygen nadir was 68% during sleep.  CARDIAC DATA Mean heart rate during sleep was 71.1 bpm.  IMPRESSIONS - Severe obstructive sleep apnea occurred during this study (AHI = 56.9/h). - No significant central sleep apnea occurred during this study (CAI = 0.0/h). - Severe oxygen desaturation was noted during this study (Min O2 = 68%, Mean 93%). - Patient snored.  DIAGNOSIS - Obstructive Sleep Apnea (327.23 [G47.33 ICD-10]) - Nocturnal Hypoxemia (327.26 [G47.36 ICD-10])  RECOMMENDATIONS - CPAP titration is usually first choice with scores in this range. Other options would be based on clinical judgment. - Positional therapy avoiding supine position during sleep. - Be careful with alcohol, sedatives and other CNS depressants that may worsen sleep apnea and disrupt normal sleep architecture. - Sleep hygiene should be reviewed to assess factors that may improve sleep quality. - Weight management and regular  exercise should be initiated or continued.  [Electronically signed] 06/04/2017 11:07 AM  Jetty Duhamel MD, ABSM Diplomate, American Board of Sleep Medicine   NPI: [id number]  Waymon Budge Diplomate, American Board of Sleep Medicine  ELECTRONICALLY SIGNED ON:  06/04/2017, 11:04 AM [facility] PH: [phone]   FX: [phone] ACCREDITED BY THE AMERICAN ACADEMY OF SLEEP MEDICINE

## 2017-06-08 DIAGNOSIS — Z1321 Encounter for screening for nutritional disorder: Secondary | ICD-10-CM | POA: Diagnosis not present

## 2017-06-08 DIAGNOSIS — R799 Abnormal finding of blood chemistry, unspecified: Secondary | ICD-10-CM | POA: Diagnosis not present

## 2017-06-23 DIAGNOSIS — Z13820 Encounter for screening for osteoporosis: Secondary | ICD-10-CM | POA: Diagnosis not present

## 2017-06-28 DIAGNOSIS — G4733 Obstructive sleep apnea (adult) (pediatric): Secondary | ICD-10-CM | POA: Diagnosis not present

## 2017-07-11 DIAGNOSIS — H903 Sensorineural hearing loss, bilateral: Secondary | ICD-10-CM | POA: Diagnosis not present

## 2017-07-29 DIAGNOSIS — G4733 Obstructive sleep apnea (adult) (pediatric): Secondary | ICD-10-CM | POA: Diagnosis not present

## 2017-08-01 DIAGNOSIS — Z9989 Dependence on other enabling machines and devices: Secondary | ICD-10-CM | POA: Diagnosis not present

## 2017-08-01 DIAGNOSIS — G4733 Obstructive sleep apnea (adult) (pediatric): Secondary | ICD-10-CM | POA: Diagnosis not present

## 2017-08-28 DIAGNOSIS — G4733 Obstructive sleep apnea (adult) (pediatric): Secondary | ICD-10-CM | POA: Diagnosis not present

## 2017-09-07 DIAGNOSIS — G4733 Obstructive sleep apnea (adult) (pediatric): Secondary | ICD-10-CM | POA: Diagnosis not present

## 2017-09-28 DIAGNOSIS — G4733 Obstructive sleep apnea (adult) (pediatric): Secondary | ICD-10-CM | POA: Diagnosis not present

## 2017-10-17 DIAGNOSIS — H6123 Impacted cerumen, bilateral: Secondary | ICD-10-CM | POA: Diagnosis not present

## 2017-10-29 DIAGNOSIS — G4733 Obstructive sleep apnea (adult) (pediatric): Secondary | ICD-10-CM | POA: Diagnosis not present

## 2017-11-14 DIAGNOSIS — L814 Other melanin hyperpigmentation: Secondary | ICD-10-CM | POA: Diagnosis not present

## 2017-11-14 DIAGNOSIS — D225 Melanocytic nevi of trunk: Secondary | ICD-10-CM | POA: Diagnosis not present

## 2017-11-14 DIAGNOSIS — D1801 Hemangioma of skin and subcutaneous tissue: Secondary | ICD-10-CM | POA: Diagnosis not present

## 2017-11-14 DIAGNOSIS — L821 Other seborrheic keratosis: Secondary | ICD-10-CM | POA: Diagnosis not present

## 2017-11-26 DIAGNOSIS — G4733 Obstructive sleep apnea (adult) (pediatric): Secondary | ICD-10-CM | POA: Diagnosis not present

## 2017-12-16 DIAGNOSIS — J101 Influenza due to other identified influenza virus with other respiratory manifestations: Secondary | ICD-10-CM | POA: Diagnosis not present

## 2017-12-16 DIAGNOSIS — R110 Nausea: Secondary | ICD-10-CM | POA: Diagnosis not present

## 2017-12-27 DIAGNOSIS — G4733 Obstructive sleep apnea (adult) (pediatric): Secondary | ICD-10-CM | POA: Diagnosis not present

## 2018-01-10 DIAGNOSIS — E785 Hyperlipidemia, unspecified: Secondary | ICD-10-CM | POA: Diagnosis not present

## 2018-01-20 DIAGNOSIS — J029 Acute pharyngitis, unspecified: Secondary | ICD-10-CM | POA: Diagnosis not present

## 2018-01-20 DIAGNOSIS — J069 Acute upper respiratory infection, unspecified: Secondary | ICD-10-CM | POA: Diagnosis not present

## 2018-01-23 DIAGNOSIS — J069 Acute upper respiratory infection, unspecified: Secondary | ICD-10-CM | POA: Diagnosis not present

## 2018-01-23 DIAGNOSIS — R05 Cough: Secondary | ICD-10-CM | POA: Diagnosis not present

## 2018-01-26 DIAGNOSIS — G4733 Obstructive sleep apnea (adult) (pediatric): Secondary | ICD-10-CM | POA: Diagnosis not present

## 2018-01-27 DIAGNOSIS — G4733 Obstructive sleep apnea (adult) (pediatric): Secondary | ICD-10-CM | POA: Diagnosis not present

## 2018-01-27 DIAGNOSIS — J209 Acute bronchitis, unspecified: Secondary | ICD-10-CM | POA: Diagnosis not present

## 2018-02-08 DIAGNOSIS — G4733 Obstructive sleep apnea (adult) (pediatric): Secondary | ICD-10-CM | POA: Diagnosis not present

## 2018-06-27 IMAGING — MG 2D DIGITAL DIAGNOSTIC UNILATERAL LEFT MAMMOGRAM WITH CAD AND ADJ
9 series · 9 of 21 positions shown · non-contrast
Comparison: Previous exam(s).

CLINICAL DATA: 51-year-old female, callback from screening
mammogram for possible left breast asymmetry

EXAM:
2D DIGITAL DIAGNOSTIC UNILATERAL LEFT MAMMOGRAM WITH CAD AND ADJUNCT
TOMO

[L ML]
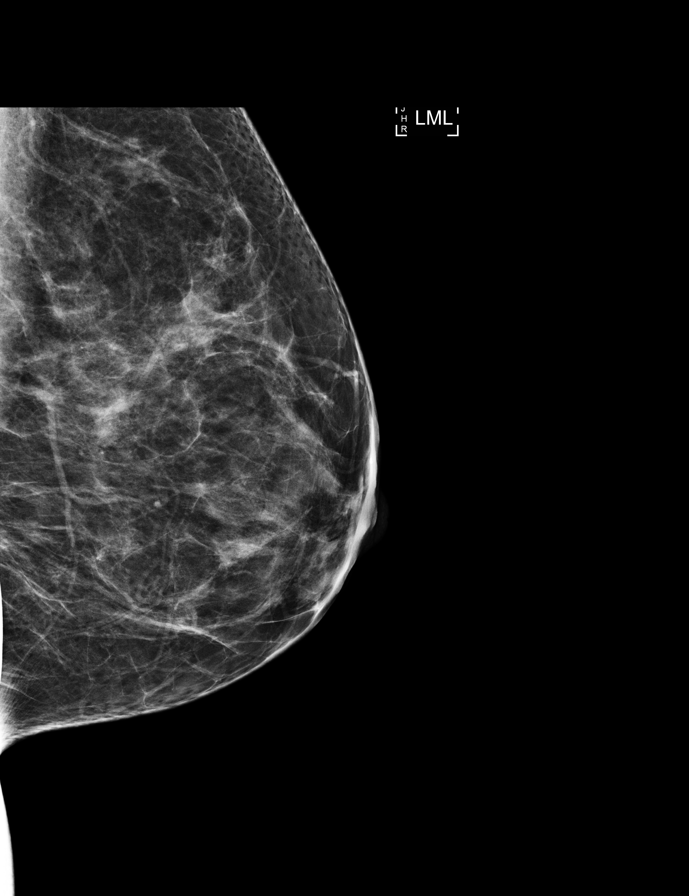

[L MLO (1 of 2)]
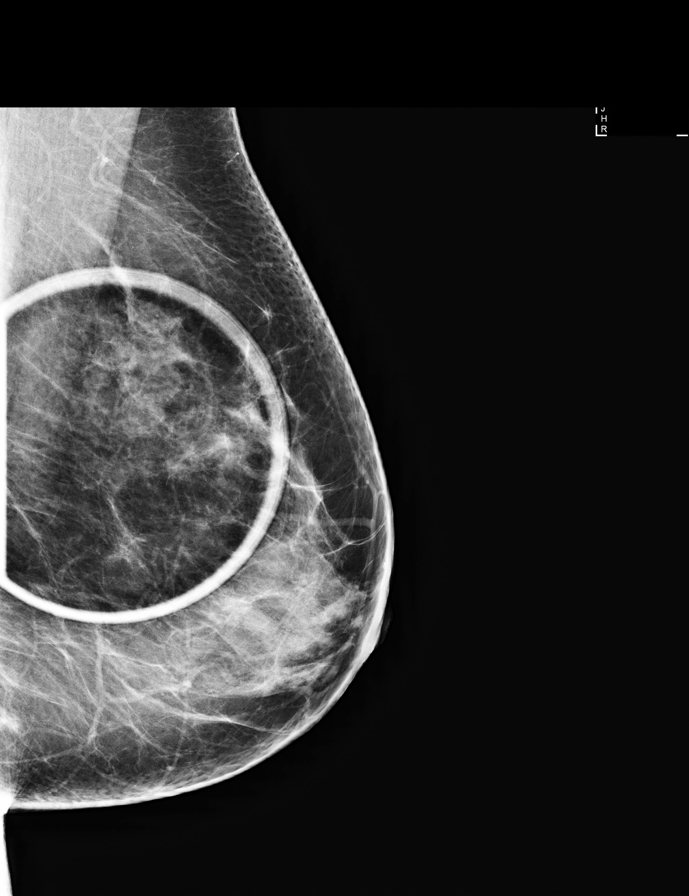

[L MLO (2 of 2)]
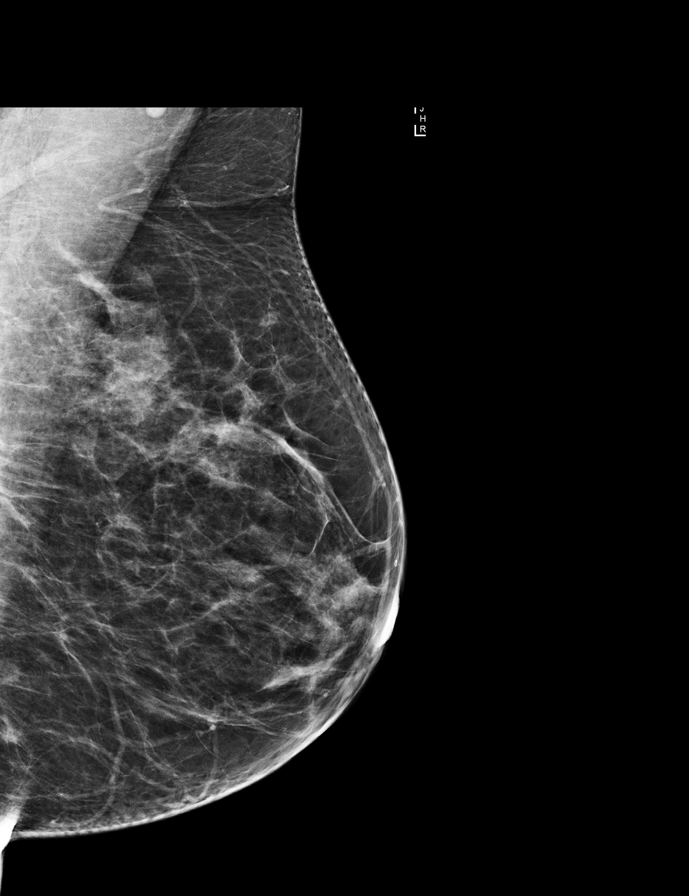

[L MLO synth-2D (1 of 2)]
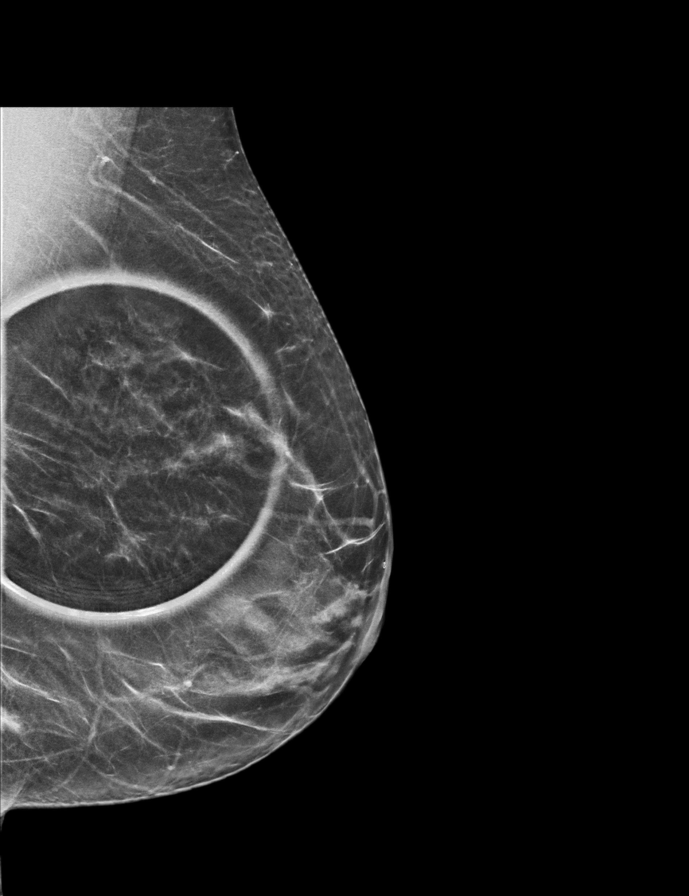

[L ML synth-2D]
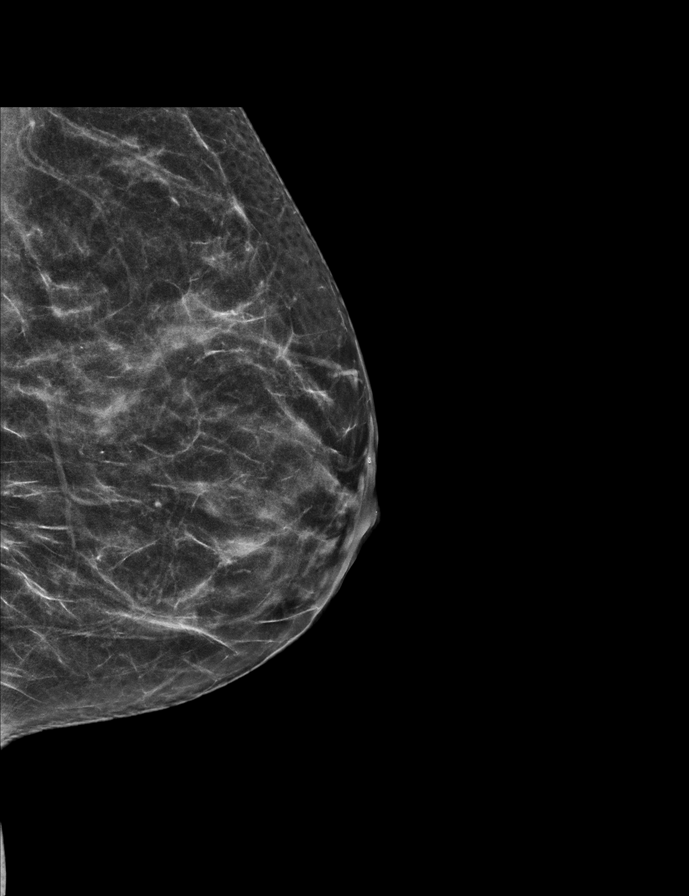

[L MLO synth-2D (2 of 2)]
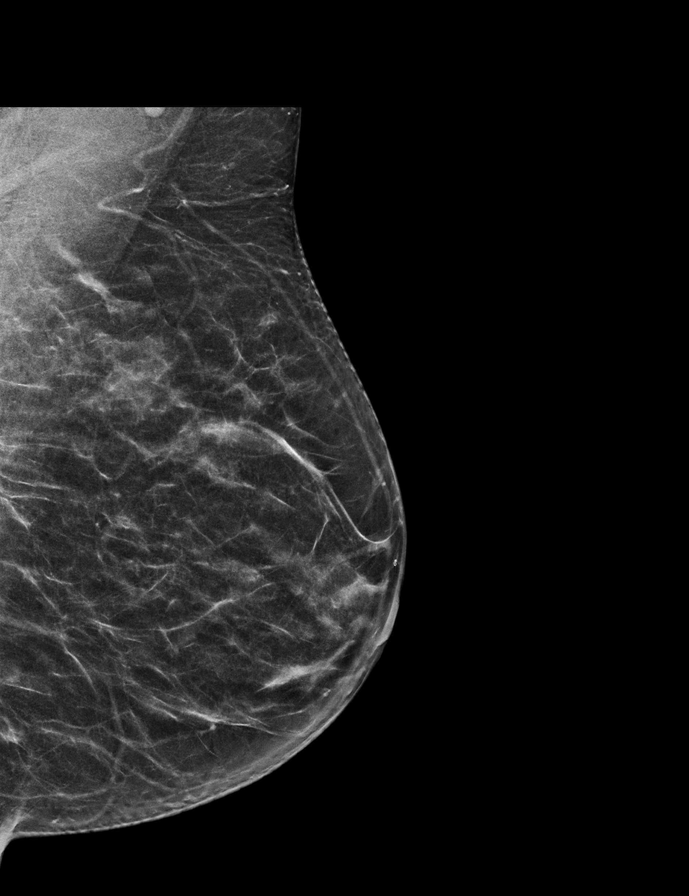

[L ML tomo · tomo slice 27/54.0]
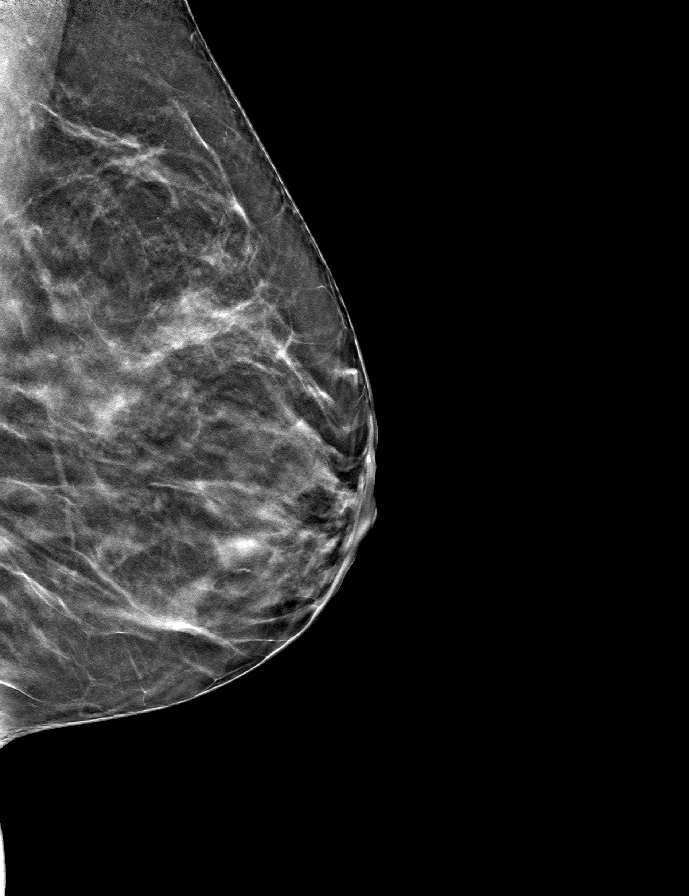

[L MLO tomo (1 of 2) · tomo slice 27/54.0]
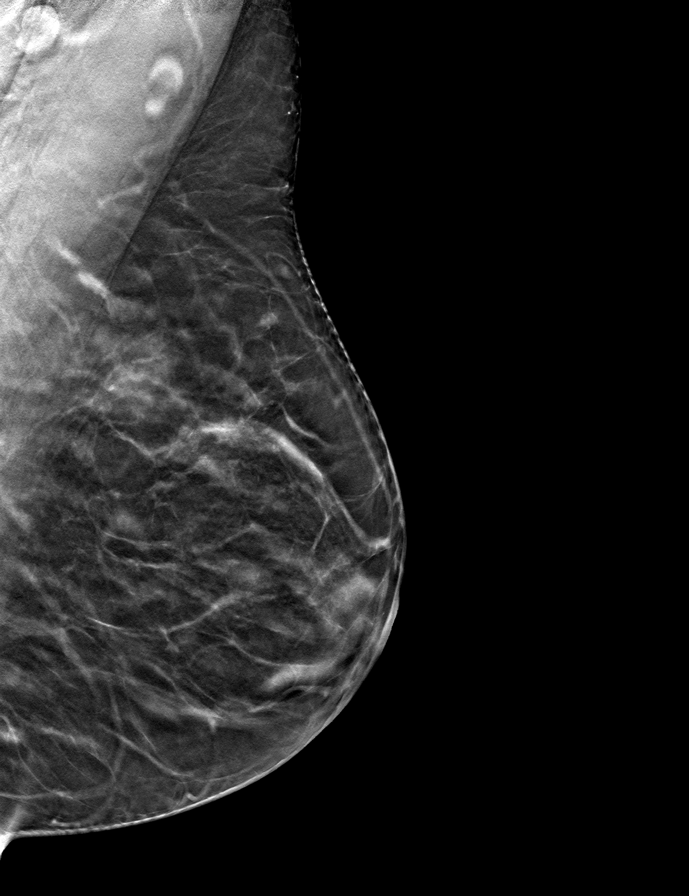

[L MLO tomo (2 of 2) · tomo slice 28/55.0]
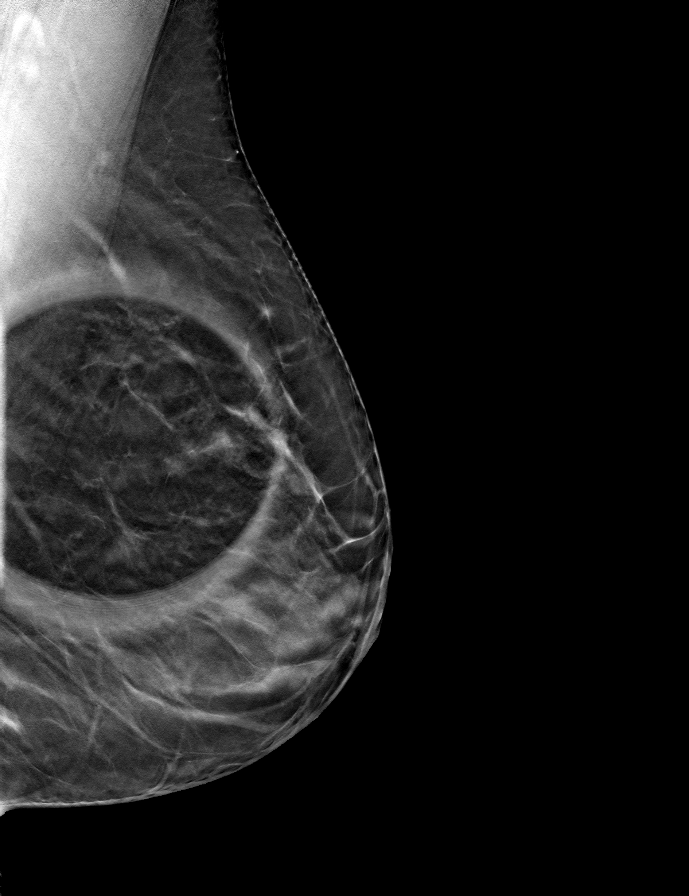

[9 of 21 positions shown; findings below may reference images not displayed]

ACR Breast Density Category b: There are scattered areas of
fibroglandular density.
FINDINGS: Additional views of the left breast demonstrate no persistent
abnormality in the area of concern identified on screening
mammogram.

Mammographic images were processed with CAD.
IMPRESSION: No mammographic evidence of malignancy.

RECOMMENDATION:
Screening mammogram in one year.(Code:[id number])

I have discussed the findings and recommendations with the patient.
Results were also provided in writing at the conclusion of the
visit. If applicable, a reminder letter will be sent to the patient
regarding the next appointment.

BI-RADS CATEGORY  1: Negative.

## 2018-07-24 DIAGNOSIS — H6123 Impacted cerumen, bilateral: Secondary | ICD-10-CM | POA: Diagnosis not present

## 2018-07-24 DIAGNOSIS — M7632 Iliotibial band syndrome, left leg: Secondary | ICD-10-CM | POA: Diagnosis not present

## 2018-07-24 DIAGNOSIS — M7582 Other shoulder lesions, left shoulder: Secondary | ICD-10-CM | POA: Diagnosis not present

## 2018-08-21 DIAGNOSIS — M25512 Pain in left shoulder: Secondary | ICD-10-CM | POA: Diagnosis not present

## 2018-08-21 DIAGNOSIS — M25552 Pain in left hip: Secondary | ICD-10-CM | POA: Diagnosis not present

## 2018-08-29 DIAGNOSIS — M25512 Pain in left shoulder: Secondary | ICD-10-CM | POA: Diagnosis not present

## 2018-08-31 DIAGNOSIS — S40012A Contusion of left shoulder, initial encounter: Secondary | ICD-10-CM | POA: Diagnosis not present

## 2018-08-31 DIAGNOSIS — M25512 Pain in left shoulder: Secondary | ICD-10-CM | POA: Diagnosis not present

## 2018-09-04 DIAGNOSIS — G4736 Sleep related hypoventilation in conditions classified elsewhere: Secondary | ICD-10-CM | POA: Diagnosis not present

## 2018-09-04 DIAGNOSIS — G4733 Obstructive sleep apnea (adult) (pediatric): Secondary | ICD-10-CM | POA: Diagnosis not present

## 2018-10-24 DIAGNOSIS — S40012D Contusion of left shoulder, subsequent encounter: Secondary | ICD-10-CM | POA: Diagnosis not present

## 2018-10-24 DIAGNOSIS — M25612 Stiffness of left shoulder, not elsewhere classified: Secondary | ICD-10-CM | POA: Diagnosis not present

## 2018-10-31 DIAGNOSIS — G4733 Obstructive sleep apnea (adult) (pediatric): Secondary | ICD-10-CM | POA: Diagnosis not present

## 2018-10-31 DIAGNOSIS — G4736 Sleep related hypoventilation in conditions classified elsewhere: Secondary | ICD-10-CM | POA: Diagnosis not present

## 2019-02-09 DIAGNOSIS — G43009 Migraine without aura, not intractable, without status migrainosus: Secondary | ICD-10-CM | POA: Diagnosis not present

## 2019-03-26 DIAGNOSIS — H6123 Impacted cerumen, bilateral: Secondary | ICD-10-CM | POA: Diagnosis not present

## 2019-03-26 DIAGNOSIS — H938X3 Other specified disorders of ear, bilateral: Secondary | ICD-10-CM | POA: Diagnosis not present

## 2019-03-26 DIAGNOSIS — E7800 Pure hypercholesterolemia, unspecified: Secondary | ICD-10-CM | POA: Diagnosis not present

## 2019-05-01 DIAGNOSIS — Z1231 Encounter for screening mammogram for malignant neoplasm of breast: Secondary | ICD-10-CM | POA: Diagnosis not present

## 2019-05-01 DIAGNOSIS — Z6830 Body mass index (BMI) 30.0-30.9, adult: Secondary | ICD-10-CM | POA: Diagnosis not present

## 2019-05-01 DIAGNOSIS — Z01419 Encounter for gynecological examination (general) (routine) without abnormal findings: Secondary | ICD-10-CM | POA: Diagnosis not present

## 2019-05-09 DIAGNOSIS — Z131 Encounter for screening for diabetes mellitus: Secondary | ICD-10-CM | POA: Diagnosis not present

## 2019-05-09 DIAGNOSIS — Z8249 Family history of ischemic heart disease and other diseases of the circulatory system: Secondary | ICD-10-CM | POA: Diagnosis not present

## 2019-05-09 DIAGNOSIS — Z780 Asymptomatic menopausal state: Secondary | ICD-10-CM | POA: Diagnosis not present

## 2019-05-09 DIAGNOSIS — Z13228 Encounter for screening for other metabolic disorders: Secondary | ICD-10-CM | POA: Diagnosis not present

## 2019-05-09 DIAGNOSIS — Z1329 Encounter for screening for other suspected endocrine disorder: Secondary | ICD-10-CM | POA: Diagnosis not present

## 2019-05-31 DIAGNOSIS — H9313 Tinnitus, bilateral: Secondary | ICD-10-CM | POA: Diagnosis not present

## 2019-05-31 DIAGNOSIS — Z01812 Encounter for preprocedural laboratory examination: Secondary | ICD-10-CM | POA: Diagnosis not present

## 2019-05-31 DIAGNOSIS — G4733 Obstructive sleep apnea (adult) (pediatric): Secondary | ICD-10-CM | POA: Diagnosis not present

## 2019-05-31 DIAGNOSIS — H903 Sensorineural hearing loss, bilateral: Secondary | ICD-10-CM | POA: Diagnosis not present

## 2019-05-31 DIAGNOSIS — Z20828 Contact with and (suspected) exposure to other viral communicable diseases: Secondary | ICD-10-CM | POA: Diagnosis not present

## 2019-05-31 DIAGNOSIS — R0902 Hypoxemia: Secondary | ICD-10-CM | POA: Diagnosis not present

## 2019-05-31 DIAGNOSIS — R0683 Snoring: Secondary | ICD-10-CM | POA: Diagnosis not present

## 2019-06-01 DIAGNOSIS — Z01810 Encounter for preprocedural cardiovascular examination: Secondary | ICD-10-CM | POA: Diagnosis not present

## 2019-06-01 DIAGNOSIS — Z01812 Encounter for preprocedural laboratory examination: Secondary | ICD-10-CM | POA: Diagnosis not present

## 2019-06-01 DIAGNOSIS — G4733 Obstructive sleep apnea (adult) (pediatric): Secondary | ICD-10-CM | POA: Diagnosis not present

## 2019-06-03 DIAGNOSIS — Z01810 Encounter for preprocedural cardiovascular examination: Secondary | ICD-10-CM | POA: Diagnosis not present

## 2019-06-03 DIAGNOSIS — G4733 Obstructive sleep apnea (adult) (pediatric): Secondary | ICD-10-CM | POA: Diagnosis not present

## 2019-06-04 DIAGNOSIS — Z30433 Encounter for removal and reinsertion of intrauterine contraceptive device: Secondary | ICD-10-CM | POA: Diagnosis not present

## 2019-06-08 DIAGNOSIS — R0602 Shortness of breath: Secondary | ICD-10-CM | POA: Diagnosis not present

## 2019-06-08 DIAGNOSIS — Z833 Family history of diabetes mellitus: Secondary | ICD-10-CM | POA: Diagnosis not present

## 2019-06-08 DIAGNOSIS — G4736 Sleep related hypoventilation in conditions classified elsewhere: Secondary | ICD-10-CM | POA: Diagnosis not present

## 2019-06-08 DIAGNOSIS — Z8249 Family history of ischemic heart disease and other diseases of the circulatory system: Secondary | ICD-10-CM | POA: Diagnosis not present

## 2019-06-08 DIAGNOSIS — F329 Major depressive disorder, single episode, unspecified: Secondary | ICD-10-CM | POA: Diagnosis not present

## 2019-06-08 DIAGNOSIS — G4733 Obstructive sleep apnea (adult) (pediatric): Secondary | ICD-10-CM | POA: Diagnosis not present

## 2019-06-08 DIAGNOSIS — K219 Gastro-esophageal reflux disease without esophagitis: Secondary | ICD-10-CM | POA: Diagnosis not present

## 2019-06-08 DIAGNOSIS — F419 Anxiety disorder, unspecified: Secondary | ICD-10-CM | POA: Diagnosis not present

## 2019-06-08 HISTORY — PX: MAXILLARY SINUS LIFT: SHX5206

## 2019-06-27 DIAGNOSIS — Z9889 Other specified postprocedural states: Secondary | ICD-10-CM | POA: Diagnosis not present

## 2019-07-16 DIAGNOSIS — R519 Headache, unspecified: Secondary | ICD-10-CM | POA: Diagnosis not present

## 2019-07-16 DIAGNOSIS — Z9889 Other specified postprocedural states: Secondary | ICD-10-CM | POA: Diagnosis not present

## 2019-07-30 DIAGNOSIS — Z9889 Other specified postprocedural states: Secondary | ICD-10-CM | POA: Diagnosis not present

## 2019-08-24 DIAGNOSIS — E7800 Pure hypercholesterolemia, unspecified: Secondary | ICD-10-CM | POA: Diagnosis not present

## 2019-09-24 DIAGNOSIS — Z9889 Other specified postprocedural states: Secondary | ICD-10-CM | POA: Diagnosis not present

## 2019-09-24 DIAGNOSIS — R200 Anesthesia of skin: Secondary | ICD-10-CM | POA: Diagnosis not present

## 2019-11-22 DIAGNOSIS — E7800 Pure hypercholesterolemia, unspecified: Secondary | ICD-10-CM | POA: Diagnosis not present

## 2019-11-22 DIAGNOSIS — Z9889 Other specified postprocedural states: Secondary | ICD-10-CM | POA: Diagnosis not present

## 2019-11-22 DIAGNOSIS — F419 Anxiety disorder, unspecified: Secondary | ICD-10-CM | POA: Diagnosis not present

## 2019-11-22 DIAGNOSIS — J3089 Other allergic rhinitis: Secondary | ICD-10-CM | POA: Diagnosis not present

## 2020-05-01 DIAGNOSIS — J310 Chronic rhinitis: Secondary | ICD-10-CM | POA: Diagnosis not present

## 2020-05-01 DIAGNOSIS — T485X5A Adverse effect of other anti-common-cold drugs, initial encounter: Secondary | ICD-10-CM | POA: Diagnosis not present

## 2020-05-01 DIAGNOSIS — Z1211 Encounter for screening for malignant neoplasm of colon: Secondary | ICD-10-CM | POA: Diagnosis not present

## 2020-05-01 DIAGNOSIS — E7800 Pure hypercholesterolemia, unspecified: Secondary | ICD-10-CM | POA: Diagnosis not present

## 2020-05-01 DIAGNOSIS — R739 Hyperglycemia, unspecified: Secondary | ICD-10-CM | POA: Diagnosis not present

## 2020-05-01 DIAGNOSIS — J3089 Other allergic rhinitis: Secondary | ICD-10-CM | POA: Diagnosis not present

## 2020-05-28 DIAGNOSIS — H903 Sensorineural hearing loss, bilateral: Secondary | ICD-10-CM | POA: Diagnosis not present

## 2020-06-10 DIAGNOSIS — Z13820 Encounter for screening for osteoporosis: Secondary | ICD-10-CM | POA: Diagnosis not present

## 2020-06-16 ENCOUNTER — Encounter: Payer: Self-pay | Admitting: Allergy

## 2020-06-16 ENCOUNTER — Other Ambulatory Visit: Payer: Self-pay

## 2020-06-16 ENCOUNTER — Ambulatory Visit: Payer: BC Managed Care – PPO | Admitting: Allergy

## 2020-06-16 VITALS — BP 126/82 | HR 85 | Temp 98.5°F | Resp 18 | Ht 66.0 in | Wt 188.0 lb

## 2020-06-16 DIAGNOSIS — H1013 Acute atopic conjunctivitis, bilateral: Secondary | ICD-10-CM | POA: Diagnosis not present

## 2020-06-16 DIAGNOSIS — J3089 Other allergic rhinitis: Secondary | ICD-10-CM | POA: Diagnosis not present

## 2020-06-16 MED ORDER — OLOPATADINE HCL 0.2 % OP SOLN: 1.0000 [drp] | Freq: Every day | OPHTHALMIC | 5 refills | Status: DC | PRN

## 2020-06-16 NOTE — Assessment & Plan Note (Signed)
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  This was done due to her small airways causing obstructive sleep apnea.  Today's skin testing showed: Positive to grass, weed. Results given.    Discussed with patient that grass pollen blooms in the summer months and weed pollen in the fall months.  These findings do not explain her perennial symptoms.  Recommend ENT evaluation for the sinuses - patient already sees one for her tinnitus.   Start environmental control measures as below.  May use over the counter antihistamines such as Zyrtec (cetirizine), Claritin (loratadine), Allegra (fexofenadine), or Xyzal (levocetirizine) daily as needed. May take twice a day if needed.   May use Flonase (fluticasone) nasal spray 1 spray per nostril twice a day as needed for nasal congestion.   May use azelastine nasal spray 1-2 sprays per nostril twice a day as needed for runny nose/drainage.  May use olopatadine eye drops 0.2% once a day as needed for itchy/watery eyes.  Nasal saline spray (i.e., Simply Saline) or nasal saline lavage (i.e., NeilMed) is recommended as needed and prior to medicated nasal sprays.  Recommend annual eye exam.

## 2020-06-16 NOTE — Assessment & Plan Note (Signed)
   See assessment and plan as above for allergic rhinitis.  

## 2020-06-16 NOTE — Patient Instructions (Addendum)
Today's skin testing showed: Positive to grass, weed.  Results given.    Environmental allergies  Start environmental control measures as below.  May use over the counter antihistamines such as Zyrtec (cetirizine), Claritin (loratadine), Allegra (fexofenadine), or Xyzal (levocetirizine) daily as needed. May take twice a day if needed.   May use Flonase (fluticasone) nasal spray 1 spray per nostril twice a day as needed for nasal congestion.   May use azelastine nasal spray 1-2 sprays per nostril twice a day as needed for runny nose/drainage.  May use olopatadine eye drops 0.2% once a day as needed for itchy/watery eyes.  Nasal saline spray (i.e., Simply Saline) or nasal saline lavage (i.e., NeilMed) is recommended as needed and prior to medicated nasal sprays.  Recommend to get your eyes checked. Recommend ENT evaluation for the sinuses.  Follow up if needed.   Reducing Pollen Exposure . Pollen seasons: trees (spring), grass (summer) and ragweed/weeds (fall). Marland Kitchen Keep windows closed in your home and car to lower pollen exposure.  Lilian Kapur air conditioning in the bedroom and throughout the house if possible.  . Avoid going out in dry windy days - especially early morning. . Pollen counts are highest between 5 - 10 AM and on dry, hot and windy days.  . Save outside activities for late afternoon or after a heavy rain, when pollen levels are lower.  . Avoid mowing of grass if you have grass pollen allergy. Marland Kitchen Be aware that pollen can also be transported indoors on people and pets.  . Dry your clothes in an automatic dryer rather than hanging them outside where they might collect pollen.  . Rinse hair and eyes before bedtime.

## 2020-06-16 NOTE — Progress Notes (Signed)
New Patient Note  RE: Sandra Kennedy MRN: [id number] DOB: 02/09/1966 Date of Office Visit: 06/16/2020  Referring provider: Johny Blamer, MD Primary care provider: Johny Blamer, MD  Chief Complaint: Allergic Rhinitis  and Sinus Problem (for several years )  History of Present Illness: I had the pleasure of seeing Sandra Kennedy for initial evaluation at the [facility] on 06/16/2020. She is a 54 y.o. female, who is referred here by Johny Blamer, MD for the evaluation of allergic rhinitis.  She reports symptoms of itchy/watery eyes, rhinorrhea, nasal congestion, sneezing. Symptoms have been going on for 1 year. Patient has maxillofacial surgery in September 2020 due to her obstructive sleep apnea. 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   The symptoms are present all year around. Other triggers include exposure to unknown. Anosmia: no. Headache: sometimes. She has used Astelin and Flonase with some improvement in symptoms. Sinus infections: no. Previous work up includes: none. Previous ENT evaluation: yes for tinnitus.  Previous sinus imaging: Xrays in the past. Last eye exam: 1 year ago. History of reflux: yes and takes Prilosec as needed.  Assessment and Plan: Sandra Kennedy is a 54 y.o. female with: Other allergic rhinitis Perennial rhinoconjunctivitis symptoms for the last 1 year after she had her maxillofacial surgery.  This was done due to her small airways causing obstructive sleep apnea.  Today's skin testing showed: Positive to grass, weed. Results given.    Discussed with patient that grass pollen blooms in the summer months and weed pollen in the fall months.  These findings do not explain her perennial symptoms.  Recommend ENT evaluation for the sinuses - patient already sees one for her  tinnitus.   Start environmental control measures as below.  May use over the counter antihistamines such as Zyrtec (cetirizine), Claritin (loratadine), Allegra (fexofenadine), or Xyzal (levocetirizine) daily as needed. May take twice a day if needed.   May use Flonase (fluticasone) nasal spray 1 spray per nostril twice a day as needed for nasal congestion.   May use azelastine nasal spray 1-2 sprays per nostril twice a day as needed for runny nose/drainage.  May use olopatadine eye drops 0.2% once a day as needed for itchy/watery eyes.  Nasal saline spray (i.e., Simply Saline) or nasal saline lavage (i.e., NeilMed) is recommended as needed and prior to medicated nasal sprays.  Recommend annual eye exam.   Allergic conjunctivitis of both eyes  See assessment and plan as above for allergic rhinitis.  Return if symptoms worsen or fail to improve.  Meds ordered this encounter  Medications  . Olopatadine HCl 0.2 % SOLN    Sig: Apply 1 drop to eye daily as needed (itchy/watery eyes).    Dispense:  2.5 mL    Refill:  5   Other allergy screening: Asthma: no Food allergy: no Medication allergy: no Hymenoptera allergy: no Urticaria: no Eczema:no History of recurrent infections suggestive of immunodeficency: no  Diagnostics: Skin Testing: Environmental allergy panel. Positive to grass, weed.   Results discussed with patient/family.  Airborne Adult Perc - 06/16/20 1001    Time Antigen Placed 1001    Allergen Manufacturer Waynette Buttery    Location Back    Number of Test 59    Panel 1 Select    1. Control-Buffer 50% Glycerol Negative    2. Control-Histamine  1 mg/ml 2+    3. Albumin saline Negative    4. Bahia Negative    5. French Southern Territ[place] Negative    6. Johnson Negative    7. Kentucky Blue Negative    8. Meadow Fescue Negative    9. Perennial Rye Negative    10. Sweet Vernal Negative    11. Timothy Negative    12. Cocklebur Negative    13. Burweed Marshelder Negative    14. Ragweed,  short Negative    15. Ragweed, Giant Negative    16. Plantain,  English Negative    17. Lamb's Quarters Negative    18. Sheep Sorrell Negative    19. Rough Pigweed Negative    20. Marsh Elder, Rough Negative    21. Mugwort, Common Negative    22. Ash mix Negative    23. Birch mix Negative    24. Beech American Negative    25. Box, Elder Negative    26. Cedar, red Negative    27. Cottonwood, Guinea-Bissau Negative    28. Elm mix Negative    29. Hickory Negative    30. Maple mix Negative    31. Oak, Guinea-Bissau mix Negative    32. Pecan Pollen Negative    33. Pine mix Negative    34. Sycamore Eastern Negative    35. Walnut, Black Pollen Negative    36. Alternaria alternata Negative    37. Cladosporium Herbarum Negative    38. Aspergillus mix Negative    39. Penicillium mix Negative    40. Bipolaris sorokiniana (Helminthosporium) Negative    41. Drechslera spicifera (Curvularia) Negative    42. Mucor plumbeus Negative    43. Fusarium moniliforme Negative    44. Aureobasidium pullulans (pullulara) Negative    45. Rhizopus oryzae Negative    46. Botrytis cinera Negative    47. Epicoccum nigrum Negative    48. Phoma betae Negative    49. Candida Albicans Negative    50. Trichophyton mentagrophytes Negative    51. Mite, D Farinae  5,000 AU/ml Negative    52. Mite, D Pteronyssinus  5,000 AU/ml Negative    53. Cat Hair 10,000 BAU/ml Negative    54.  Dog Epithelia Negative    55. Mixed Feathers Negative    56. Horse Epithelia Negative    57. Cockroach, German Negative    58. Mouse Negative    59. Tobacco Leaf Negative          Intradermal - 06/16/20 1036    Time Antigen Placed 1036    Allergen Manufacturer Waynette Buttery    Location Arm    Number of Test 15    Control Negative    French Southern Territ[place] 2+    Johnson Negative    7 Grass Negative    Ragweed mix Negative    Weed mix 2+    Tree mix Negative    Mold 1 Negative    Mold 2 Negative    Mold 3 Negative    Mold 4 Negative    Cat Negative      Dog Negative    Cockroach Negative    Mite mix Negative           Past Medical History: Patient Active Problem List   Diagnosis Date Noted  . Other allergic rhinitis 06/16/2020  . Allergic conjunctivitis of both eyes 06/16/2020   Past Medical History:  Diagnosis Date  . Abdominal pain   . Anxiety   . Blood in urine   . Chills   .  Depression   . Difficulty urinating   . Dizziness   . GERD (gastroesophageal reflux disease)   . Hyperlipidemia   . Night sweats   . Pain    rectal  . Poor appetite   . Poor circulation   . Rectal bleeding   . Sleep difficulties   . Tinnitus    Past Surgical History: Past Surgical History:  Procedure Laterality Date  . ECTROPION REPAIR Bilateral 05/04/2016   Procedure: REPAIR OF ECTROPION WITH CONJUNCTIVOPLASTY;  Surgeon: Imagene Riches, MD;  Location: [facility];  Service: Ophthalmology;  Laterality: Bilateral;  . EYE SURGERY    . MAXILLARY SINUS LIFT  06/08/2019  . MOUTH SURGERY  around the age of 60   Medication List:  Current Outpatient Medications  Medication Sig Dispense Refill  . aspirin 81 MG chewable tablet Chew 81 mg by mouth daily.    Marland Kitchen azelastine (ASTELIN) 0.1 % nasal spray Place 1 spray into both nostrils 2 (two) times daily.    . Cholecalciferol (VITAMIN D3) 25 MCG (1000 UT) CAPS Take by mouth.    . citalopram (CELEXA) 20 MG tablet Take 20 mg by mouth daily.    . fluticasone (FLONASE) 50 MCG/ACT nasal spray Place 1 spray into both nostrils daily.    Marland Kitchen ibuprofen (ADVIL,MOTRIN) 200 MG tablet Take 200 mg by mouth every 6 (six) hours as needed for fever or mild pain.    Marland Kitchen levonorgestrel (MIRENA) 20 MCG/24HR IUD 1 each by Intrauterine route once.    . simvastatin (ZOCOR) 20 MG tablet Take 20 mg by mouth every evening.  7  . vitamin B-12 (CYANOCOBALAMIN) 1000 MCG tablet Take 1,000 mcg by mouth daily.    Marland Kitchen Dextromethorphan-Menthol (DELSYM COUGH RELIEF MT) Use as directed in the mouth or throat as directed. (Patient not  taking: Reported on 06/16/2020)    . Olopatadine HCl 0.2 % SOLN Apply 1 drop to eye daily as needed (itchy/watery eyes). 2.5 mL 5   No current facility-administered medications for this visit.   Allergies: No Known Allergies Social History: Social History   Socioeconomic History  . Marital status: Divorced    Spouse name: Not on file  . Number of children: Not on file  . Years of education: Not on file  . Highest education level: Not on file  Occupational History  . Not on file  Tobacco Use  . Smoking status: Never Smoker  . Smokeless tobacco: Never Used  Vaping Use  . Vaping Use: Never used  Substance and Sexual Activity  . Alcohol use: Yes    Comment: socially  . Drug use: No  . Sexual activity: Not on file  Other Topics Concern  . Not on file  Social History Narrative  . Not on file   Social Determinants of Health   Financial Resource Strain:   . Difficulty of Paying Living Expenses: Not on file  Food Insecurity:   . Worried About Programme researcher, broadcasting/film/video in the Last Year: Not on file  . Ran Out of Food in the Last Year: Not on file  Transportation Needs:   . Lack of Transportation (Medical): Not on file  . Lack of Transportation (Non-Medical): Not on file  Physical Activity:   . Days of Exercise per Week: Not on file  . Minutes of Exercise per Session: Not on file  Stress:   . Feeling of Stress : Not on file  Social Connections:   . Frequency of Communication with Friends and Family: Not  on file  . Frequency of Social Gatherings with Friends and Family: Not on file  . Attends Religious Services: Not on file  . Active Member of Clubs or Organizations: Not on file  . Attends BankerClub or Organization Meetings: Not on file  . Marital Status: Not on file   Lives in a house built in the [id number]. Smoking: denies Occupation: Oceanographerbilling analyst  Environmental History: Water Damage/mildew in the house: no Carpet in the family room: no Carpet in the bedroom: yes Heating:  electric Cooling: central Pet: yes 1 dog x 3 yrs and 1 cat x 4 yrs  Family History: Family History  Problem Relation Age of Onset  . Diabetes Mother   . Diabetes Father   . Heart disease Father    Problem                               Relation Asthma                                   No  Eczema                                No  Food allergy                          No  Allergic rhino conjunctivitis     No   Review of Systems  Constitutional: Negative for appetite change, chills, fever and unexpected weight change.  HENT: Positive for congestion, rhinorrhea and sneezing.   Eyes: Positive for itching.  Respiratory: Negative for cough, chest tightness, shortness of breath and wheezing.   Cardiovascular: Negative for chest pain.  Gastrointestinal: Negative for abdominal pain.  Genitourinary: Negative for difficulty urinating.  Skin: Negative for rash.  Allergic/Immunologic: Positive for environmental allergies.  Neurological: Negative for headaches.   Objective: BP 126/82   Pulse 85   Temp 98.5 F (36.9 C) (Temporal)   Resp 18   Ht 5\' 6"  (1.676 m)   Wt 188 lb (85.3 kg)   SpO2 98%   BMI 30.34 kg/m  Body mass index is 30.34 kg/m. Physical Exam Vitals and nursing note reviewed.  Constitutional:      Appearance: Normal appearance. She is well-developed.  HENT:     Head: Normocephalic and atraumatic.     Ears:     Comments: Very narrowed ear canal b/.    Nose: Nose normal.     Mouth/Throat:     Mouth: Mucous membranes are moist.     Pharynx: Oropharynx is clear.  Eyes:     Conjunctiva/sclera: Conjunctivae normal.  Cardiovascular:     Rate and Rhythm: Normal rate and regular rhythm.     Heart sounds: Normal heart sounds. No murmur heard.  No friction rub. No gallop.   Pulmonary:     Effort: Pulmonary effort is normal.     Breath sounds: Normal breath sounds. No wheezing, rhonchi or rales.  Musculoskeletal:     Cervical back: Neck supple.  Skin:    General:  Skin is warm.     Findings: No rash.  Neurological:     Mental Status: She is alert and oriented to person, place, and time.  Psychiatric:        Behavior: Behavior normal.  The plan was reviewed with the patient/family, and all questions/concerned were addressed.  It was my pleasure to see Sandra Kennedy today and participate in her care. Please feel free to contact me with any questions or concerns.  Sincerely,  Wyline Mood, DO Allergy & Immunology  [facility] office: [phone] [facility] office: [phone]

## 2020-06-18 DIAGNOSIS — Z1231 Encounter for screening mammogram for malignant neoplasm of breast: Secondary | ICD-10-CM | POA: Diagnosis not present

## 2020-06-18 DIAGNOSIS — Z01419 Encounter for gynecological examination (general) (routine) without abnormal findings: Secondary | ICD-10-CM | POA: Diagnosis not present

## 2020-06-18 DIAGNOSIS — Z6829 Body mass index (BMI) 29.0-29.9, adult: Secondary | ICD-10-CM | POA: Diagnosis not present

## 2020-08-03 DIAGNOSIS — H9193 Unspecified hearing loss, bilateral: Secondary | ICD-10-CM | POA: Diagnosis not present

## 2020-08-03 DIAGNOSIS — H6123 Impacted cerumen, bilateral: Secondary | ICD-10-CM | POA: Diagnosis not present

## 2020-09-29 ENCOUNTER — Other Ambulatory Visit: Payer: Self-pay | Admitting: Gastroenterology

## 2020-09-29 DIAGNOSIS — K219 Gastro-esophageal reflux disease without esophagitis: Secondary | ICD-10-CM | POA: Diagnosis not present

## 2020-09-29 DIAGNOSIS — Z01818 Encounter for other preprocedural examination: Secondary | ICD-10-CM | POA: Diagnosis not present

## 2020-09-29 DIAGNOSIS — R197 Diarrhea, unspecified: Secondary | ICD-10-CM | POA: Diagnosis not present

## 2020-09-29 DIAGNOSIS — R1013 Epigastric pain: Secondary | ICD-10-CM | POA: Diagnosis not present

## 2020-10-01 ENCOUNTER — Encounter (HOSPITAL_COMMUNITY): Payer: Self-pay | Admitting: Gastroenterology

## 2020-10-01 ENCOUNTER — Other Ambulatory Visit: Payer: Self-pay

## 2020-10-02 ENCOUNTER — Other Ambulatory Visit (HOSPITAL_COMMUNITY)
Admission: RE | Admit: 2020-10-02 | Discharge: 2020-10-02 | Disposition: A | Discharge status: Home / Self Care | Payer: BC Managed Care – PPO | Source: Ambulatory Visit | Attending: Gastroenterology | Admitting: Gastroenterology

## 2020-10-02 DIAGNOSIS — Z20822 Contact with and (suspected) exposure to covid-19: Secondary | ICD-10-CM | POA: Insufficient documentation

## 2020-10-02 DIAGNOSIS — Z01812 Encounter for preprocedural laboratory examination: Secondary | ICD-10-CM | POA: Insufficient documentation

## 2020-10-02 LAB — SARS CORONAVIRUS 2 (TAT 6-24 HRS): SARS Coronavirus 2: NEGATIVE

## 2020-10-23 DIAGNOSIS — H02834 Dermatochalasis of left upper eyelid: Secondary | ICD-10-CM | POA: Diagnosis not present

## 2020-10-23 DIAGNOSIS — H04123 Dry eye syndrome of bilateral lacrimal glands: Secondary | ICD-10-CM | POA: Diagnosis not present

## 2020-10-23 DIAGNOSIS — H04553 Acquired stenosis of bilateral nasolacrimal duct: Secondary | ICD-10-CM | POA: Diagnosis not present

## 2020-10-23 DIAGNOSIS — H04223 Epiphora due to insufficient drainage, bilateral lacrimal glands: Secondary | ICD-10-CM | POA: Diagnosis not present

## 2020-10-23 DIAGNOSIS — H02135 Senile ectropion of left lower eyelid: Secondary | ICD-10-CM | POA: Diagnosis not present

## 2020-10-23 DIAGNOSIS — H02132 Senile ectropion of right lower eyelid: Secondary | ICD-10-CM | POA: Diagnosis not present

## 2020-10-23 DIAGNOSIS — H02831 Dermatochalasis of right upper eyelid: Secondary | ICD-10-CM | POA: Diagnosis not present

## 2020-11-03 DIAGNOSIS — R7303 Prediabetes: Secondary | ICD-10-CM | POA: Diagnosis not present

## 2020-11-03 DIAGNOSIS — E7800 Pure hypercholesterolemia, unspecified: Secondary | ICD-10-CM | POA: Diagnosis not present

## 2020-11-03 DIAGNOSIS — G4733 Obstructive sleep apnea (adult) (pediatric): Secondary | ICD-10-CM | POA: Diagnosis not present

## 2020-11-03 DIAGNOSIS — R200 Anesthesia of skin: Secondary | ICD-10-CM | POA: Diagnosis not present

## 2020-11-18 DIAGNOSIS — G4733 Obstructive sleep apnea (adult) (pediatric): Secondary | ICD-10-CM | POA: Diagnosis not present

## 2020-11-22 DIAGNOSIS — G4733 Obstructive sleep apnea (adult) (pediatric): Secondary | ICD-10-CM | POA: Diagnosis not present

## 2020-11-24 ENCOUNTER — Other Ambulatory Visit: Payer: Self-pay

## 2020-11-24 ENCOUNTER — Encounter (HOSPITAL_COMMUNITY): Payer: Self-pay | Admitting: Gastroenterology

## 2020-11-25 DIAGNOSIS — M2602 Maxillary hypoplasia: Secondary | ICD-10-CM | POA: Diagnosis not present

## 2020-11-25 DIAGNOSIS — G4733 Obstructive sleep apnea (adult) (pediatric): Secondary | ICD-10-CM | POA: Diagnosis not present

## 2020-11-25 DIAGNOSIS — M2606 Microgenia: Secondary | ICD-10-CM | POA: Diagnosis not present

## 2020-11-25 DIAGNOSIS — M2604 Mandibular hypoplasia: Secondary | ICD-10-CM | POA: Diagnosis not present

## 2020-11-26 ENCOUNTER — Other Ambulatory Visit (HOSPITAL_COMMUNITY)
Admission: RE | Admit: 2020-11-26 | Discharge: 2020-11-26 | Disposition: A | Discharge status: Home / Self Care | Payer: BC Managed Care – PPO | Source: Ambulatory Visit | Attending: Gastroenterology | Admitting: Gastroenterology

## 2020-11-26 DIAGNOSIS — Z20822 Contact with and (suspected) exposure to covid-19: Secondary | ICD-10-CM | POA: Insufficient documentation

## 2020-11-26 DIAGNOSIS — Z01812 Encounter for preprocedural laboratory examination: Secondary | ICD-10-CM | POA: Insufficient documentation

## 2020-11-26 DIAGNOSIS — R12 Heartburn: Secondary | ICD-10-CM | POA: Diagnosis not present

## 2020-11-26 DIAGNOSIS — K3189 Other diseases of stomach and duodenum: Secondary | ICD-10-CM | POA: Diagnosis not present

## 2020-11-26 DIAGNOSIS — Z1211 Encounter for screening for malignant neoplasm of colon: Secondary | ICD-10-CM | POA: Diagnosis not present

## 2020-11-26 DIAGNOSIS — K449 Diaphragmatic hernia without obstruction or gangrene: Secondary | ICD-10-CM | POA: Diagnosis not present

## 2020-11-26 DIAGNOSIS — K621 Rectal polyp: Secondary | ICD-10-CM | POA: Diagnosis not present

## 2020-11-26 DIAGNOSIS — K219 Gastro-esophageal reflux disease without esophagitis: Secondary | ICD-10-CM | POA: Diagnosis not present

## 2020-11-26 LAB — SARS CORONAVIRUS 2 (TAT 6-24 HRS): SARS Coronavirus 2: NEGATIVE

## 2020-11-27 NOTE — Anesthesia Preprocedure Evaluation (Addendum)
Anesthesia Evaluation  Patient identified by MRN, date of birth, ID band Patient awake    Reviewed: Allergy & Precautions, NPO status , Patient's Chart, lab work & pertinent test results  History of Anesthesia Complications Negative for: history of anesthetic complications  Airway Mallampati: III  TM Distance: >3 FB Neck ROM: Full    Dental  (+) Dental Advisory Given, Teeth Intact   Pulmonary sleep apnea (s/p surgery, awaiting retesting prior to possible CPAP) ,    Pulmonary exam normal        Cardiovascular negative cardio ROS Normal cardiovascular exam     Neuro/Psych PSYCHIATRIC DISORDERS Anxiety Depression negative neurological ROS     GI/Hepatic Neg liver ROS, GERD  Medicated and Controlled,  Endo/Other  negative endocrine ROS  Renal/GU negative Renal ROS     Musculoskeletal negative musculoskeletal ROS (+)   Abdominal   Peds  Hematology negative hematology ROS (+)   Anesthesia Other Findings Covid test negative   Reproductive/Obstetrics                            Anesthesia Physical Anesthesia Plan  ASA: II  Anesthesia Plan: MAC   Post-op Pain Management:    Induction: Intravenous  PONV Risk Score and Plan: 2 and Propofol infusion and Treatment may vary due to age or medical condition  Airway Management Planned: Nasal Cannula and Natural Airway  Additional Equipment: None  Intra-op Plan:   Post-operative Plan:   Informed Consent: I have reviewed the patients History and Physical, chart, labs and discussed the procedure including the risks, benefits and alternatives for the proposed anesthesia with the patient or authorized representative who has indicated his/her understanding and acceptance.       Plan Discussed with: CRNA and Anesthesiologist  Anesthesia Plan Comments:        Anesthesia Quick Evaluation

## 2020-11-27 NOTE — H&P (Signed)
History of Present Illness  General:          55 year old female referred for acid reflux, diarrhea and colonoscopy.        Last seen by Dr. Evette Cristal in 2012 for suspected anal fissure and was given diltiazem ointment.        Fecal occult blood negative from 8/21, CBC unremarkable from 8/21, GFR of 58 otherwise unremarkable CMP.        She states that she has to take prilosec/generic omeprazole at least every other day, she waits until she has epigastric burning and heartburn, as she is scared of taking it daily.        She has had acid reflux and heartburn, since she started using CPAP since 2019, (stopped using it since 05/2019, after surgery to open her airway, has reconstructive surgery).        Denies difficulty or pain on swallowing.        No prior EGD.        She c/o having a "sensitive stomach".        She has indigestion.        She has been havign acid reflux even at night.        Denies weight loss or loss of appetite, feels that she is gaining weight despite making an effort to lose weight(from 160 lbs to 190lbs).        Her Bms are OK, the last few weeks, with some stress, she has had some diarrhea, a few times a month, last week and the week before diarrhea lasted for 2 weeks, stools were loose and watery, 1-2/day, she also had nocturnal diarrhea, denies blood in stool or black stools, she has occasional abdominal pain, denies rectal pain.        There is no family history of colonoscopy.        No prior colonoscopy.     Vital Signs  Wt 194, Wt change -.2 lb, Ht 66, BMI 31.31, Temp 98.7, Pulse sitting 72, BP sitting 150/94.     Examination  Gastroenterology::       GENERAL APPEARANCE: Well developed, well nourished, no active distress, pleasant.        SCLERA: anicteric.        CARDIOVASCULAR Normal RRR .        RESPIRATORY Breath sounds normal. Respiration even and unlabored.        ABDOMEN No masses palpated. Liver and spleen not palpated, normal. Bowel sounds normal, Abdomen  not distended.        EXTREMITIES: No edema.        NEURO: alert, oriented to time, place and person, normal gait.        PSYCH: mood/affect normal.      Assessments  1. Epigastric burning sensation - R10.13 (Primary)  2. Mild acid reflux - K21.9  3. Screen for colon cancer - Z12.11  4. Intermittent diarrhea - R19.7   Esophagoscopy Notes: Recommend EGD with antral biopsies to evaluate for H pylori and small bowel biopsies to evaluate for celiac disease.      Mild acid reflux   Esophagoscopy Notes: Avoid or limit caffeinated products, weight loss, space last meal of the day and bedtime by at least 3 hours, avoid or limit alcohol.      Screen for colon cancer   Colonoscopy The risks and the benefits of the procedure were discussed with the patient in details. She understands and verbalizes consent. Since she had jaw surgeries  and may have airway issues, recommend getting the procedures done at [facility] as an outpatient.       Intermittent diarrhea   Colonoscopy Esophagoscopy                Notes: Will obtain random colon biopsies to evaluate microscopic colitis.

## 2020-11-28 ENCOUNTER — Other Ambulatory Visit: Payer: Self-pay

## 2020-11-28 ENCOUNTER — Ambulatory Visit (HOSPITAL_COMMUNITY): Payer: BC Managed Care – PPO | Admitting: Certified Registered Nurse Anesthetist

## 2020-11-28 ENCOUNTER — Ambulatory Visit (HOSPITAL_COMMUNITY)
Admission: RE | Admit: 2020-11-28 | Discharge: 2020-11-28 | Disposition: A | Discharge status: Home / Self Care | Payer: BC Managed Care – PPO | Attending: Gastroenterology | Admitting: Gastroenterology

## 2020-11-28 ENCOUNTER — Encounter (HOSPITAL_COMMUNITY)
Admission: RE | Disposition: A | Discharge status: Home / Self Care | Payer: Self-pay | Source: Home / Self Care | Attending: Gastroenterology

## 2020-11-28 ENCOUNTER — Encounter (HOSPITAL_COMMUNITY): Payer: Self-pay | Admitting: Gastroenterology

## 2020-11-28 DIAGNOSIS — Z20822 Contact with and (suspected) exposure to covid-19: Secondary | ICD-10-CM | POA: Insufficient documentation

## 2020-11-28 DIAGNOSIS — K219 Gastro-esophageal reflux disease without esophagitis: Secondary | ICD-10-CM | POA: Insufficient documentation

## 2020-11-28 DIAGNOSIS — R1013 Epigastric pain: Secondary | ICD-10-CM | POA: Diagnosis not present

## 2020-11-28 DIAGNOSIS — Z139 Encounter for screening, unspecified: Secondary | ICD-10-CM | POA: Diagnosis not present

## 2020-11-28 DIAGNOSIS — Z1211 Encounter for screening for malignant neoplasm of colon: Secondary | ICD-10-CM | POA: Diagnosis not present

## 2020-11-28 DIAGNOSIS — K2970 Gastritis, unspecified, without bleeding: Secondary | ICD-10-CM | POA: Diagnosis not present

## 2020-11-28 DIAGNOSIS — K449 Diaphragmatic hernia without obstruction or gangrene: Secondary | ICD-10-CM | POA: Diagnosis not present

## 2020-11-28 DIAGNOSIS — K3189 Other diseases of stomach and duodenum: Secondary | ICD-10-CM | POA: Insufficient documentation

## 2020-11-28 DIAGNOSIS — R12 Heartburn: Secondary | ICD-10-CM | POA: Diagnosis not present

## 2020-11-28 DIAGNOSIS — K2950 Unspecified chronic gastritis without bleeding: Secondary | ICD-10-CM | POA: Diagnosis not present

## 2020-11-28 DIAGNOSIS — K621 Rectal polyp: Secondary | ICD-10-CM | POA: Diagnosis not present

## 2020-11-28 DIAGNOSIS — R109 Unspecified abdominal pain: Secondary | ICD-10-CM | POA: Diagnosis not present

## 2020-11-28 HISTORY — PX: ESOPHAGOGASTRODUODENOSCOPY (EGD) WITH PROPOFOL: SHX5813

## 2020-11-28 HISTORY — DX: Sleep apnea, unspecified: G47.30

## 2020-11-28 HISTORY — PX: BIOPSY: SHX5522

## 2020-11-28 HISTORY — PX: COLONOSCOPY WITH PROPOFOL: SHX5780

## 2020-11-28 SURGERY — ESOPHAGOGASTRODUODENOSCOPY (EGD) WITH PROPOFOL
Anesthesia: Monitor Anesthesia Care

## 2020-11-28 MED ORDER — LACTATED RINGERS IV SOLN
INTRAVENOUS | Status: DC
  Administered 2020-11-28: 1000 mL via INTRAVENOUS

## 2020-11-28 MED ORDER — LIDOCAINE 2% (20 MG/ML) 5 ML SYRINGE
INTRAMUSCULAR | Status: DC | PRN
  Administered 2020-11-28: 60 mg via INTRAVENOUS

## 2020-11-28 MED ORDER — PROPOFOL 500 MG/50ML IV EMUL
INTRAVENOUS | Status: DC | PRN
  Administered 2020-11-28: 150 ug/kg/min via INTRAVENOUS

## 2020-11-28 MED ORDER — PROPOFOL 1000 MG/100ML IV EMUL
INTRAVENOUS | Status: AC
  Filled 2020-11-28: qty 100

## 2020-11-28 MED ORDER — PROPOFOL 10 MG/ML IV BOLUS
INTRAVENOUS | Status: DC | PRN
  Administered 2020-11-28: 30 mg via INTRAVENOUS

## 2020-11-28 MED ORDER — SODIUM CHLORIDE 0.9 % IV SOLN: INTRAVENOUS | Status: DC

## 2020-11-28 SURGICAL SUPPLY — 25 items

## 2020-11-28 NOTE — Op Note (Signed)
[facility] Patient Name: Sandra Kennedy Procedure Date: 11/28/2020 MRN: [id number] Attending MD: Kerin Salen , MD Date of Birth: 03/08/1966 CSN: [id number] Age: 55 Admit Type: Outpatient Procedure:                Colonoscopy Indications:              Screening for colorectal malignant neoplasm, This                            is the patient's first colonoscopy, Incidental                            diarrhea intermittent noted Providers:                Kerin Salen, MD, Fayrene Fearing, RN, Lawson Radar,                            Technician, Nadene Rubins Referring MD:             Johny Blamer, MD Medicines:                Monitored Anesthesia Care Complications:            No immediate complications. Estimated blood loss:                            Minimal. Estimated Blood Loss:     Estimated blood loss was minimal. Procedure:                Pre-Anesthesia Assessment:                           - Prior to the procedure, a History and Physical                            was performed, and patient medications and                            allergies were reviewed. The patient's tolerance of                            previous anesthesia was also reviewed. The risks                            and benefits of the procedure and the sedation                            options and risks were discussed with the patient.                            All questions were answered, and informed consent                            was obtained. Prior Anticoagulants: The patient has                            taken no previous  anticoagulant or antiplatelet                            agents. ASA Grade Assessment: II - A patient with                            mild systemic disease. After reviewing the risks                            and benefits, the patient was deemed in                            satisfactory condition to undergo the procedure.                           After obtaining  informed consent, the colonoscope                            was passed under direct vision. Throughout the                            procedure, the patient's blood pressure, pulse, and                            oxygen saturations were monitored continuously. The                            [id number]-[id number] ([id number] Olympus pediatric colonscope                            was introduced through the anus and advanced to the                            the terminal ileum. The colonoscopy was performed                            without difficulty. The patient tolerated the                            procedure well. The quality of the bowel                            preparation was good. Scope In: 8:17:39 AM Scope Out: 8:33:14 AM Scope Withdrawal Time: 0 hours 11 minutes 48 seconds  Total Procedure Duration: 0 hours 15 minutes 35 seconds  Findings:      The perianal and digital rectal examinations were normal.      The terminal ileum appeared normal.      A 5 mm polyp was found in the rectum. The polyp was sessile. The polyp       was removed with a cold biopsy forceps. Resection and retrieval were       complete.      Biopsies for histology were taken with a cold forceps for evaluation of       microscopic colitis.      The  exam was otherwise without abnormality on direct and retroflexion       views. Impression:               - The examined portion of the ileum was normal.                           - One 5 mm polyp in the rectum, removed with a cold                            biopsy forceps. Resected and retrieved.                           - The examination was otherwise normal on direct                            and retroflexion views.                           - Biopsies were taken with a cold forceps for                            evaluation of microscopic colitis. Moderate Sedation:      Patient did not receive moderate sedation for this procedure, but       instead received  monitored anesthesia care. Recommendation:           - Patient has a contact number available for                            emergencies. The signs and symptoms of potential                            delayed complications were discussed with the                            patient. Return to normal activities tomorrow.                            Written discharge instructions were provided to the                            patient.                           - Resume regular diet.                           - Continue present medications.                           - Await pathology results.                           - Repeat colonoscopy for surveillance based on  pathology results. Procedure Code(s):        --- Professional ---                           [phone], Colonoscopy, flexible; with biopsy, single                            or multiple Diagnosis Code(s):        --- Professional ---                           Z12.11, Encounter for screening for malignant                            neoplasm of colon                           K62.1, Rectal polyp CPT copyright 2019 American Medical Association. All rights reserved. The codes documented in this report are preliminary and upon coder review may  be revised to meet current compliance requirements. Kerin Salen, MD 11/28/2020 8:46:55 AM This report has been signed electronically. Number of Addenda: 0

## 2020-11-28 NOTE — Brief Op Note (Signed)
11/28/2020  8:47 AM  PATIENT:  Sandra Kennedy  55 y.o. female  PRE-OPERATIVE DIAGNOSIS:  Screening, reflux, abd pain  POST-OPERATIVE DIAGNOSIS:  EGD WNL,biopsies taken COLON:Rectal polyp removed  PROCEDURE:  Procedure(s): ESOPHAGOGASTRODUODENOSCOPY (EGD) WITH PROPOFOL (N/A) COLONOSCOPY WITH PROPOFOL (N/A) BIOPSY  SURGEON:  Surgeon(s) and Role:    Ronnette Juniper, MD - Primary  PHYSICIAN ASSISTANT:   ASSISTANTS: Al Decant, Tech  ANESTHESIA:   MAC  EBL:  Minimal  BLOOD ADMINISTERED:none  DRAINS: none   LOCAL MEDICATIONS USED:  NONE  SPECIMEN:  Biopsy / Limited Resection  DISPOSITION OF SPECIMEN:  PATHOLOGY  COUNTS:  YES  TOURNIQUET:  * No tourniquets in log *  DICTATION: .Dragon Dictation  PLAN OF CARE: Discharge to home after PACU  PATIENT DISPOSITION:  PACU - hemodynamically stable.   Delay start of Pharmacological VTE agent (>24hrs) due to surgical blood loss or risk of bleeding: not applicable

## 2020-11-28 NOTE — Anesthesia Postprocedure Evaluation (Signed)
Anesthesia Post Note  Patient: Sandra Kennedy  Procedure(s) Performed: ESOPHAGOGASTRODUODENOSCOPY (EGD) WITH PROPOFOL (N/A ) COLONOSCOPY WITH PROPOFOL (N/A ) BIOPSY     Patient location during evaluation: PACU Anesthesia Type: MAC Level of consciousness: awake and alert Pain management: pain level controlled Vital Signs Assessment: post-procedure vital signs reviewed and stable Respiratory status: spontaneous breathing, nonlabored ventilation and respiratory function stable Cardiovascular status: stable and blood pressure returned to baseline Anesthetic complications: no   No complications documented.  Last Vitals:  Vitals:   11/28/20 0850 11/28/20 0900  BP: 118/78 138/82  Pulse: 76 70  Resp: 17 17  Temp:    SpO2: 100% 100%    Last Pain:  Vitals:   11/28/20 0900  TempSrc:   PainSc: 0-No pain                 Audry Pili

## 2020-11-28 NOTE — Anesthesia Procedure Notes (Signed)
Procedure Name: MAC Date/Time: 11/28/2020 8:02 AM Performed by: Claudia Desanctis, CRNA Pre-anesthesia Checklist: Patient identified, Emergency Drugs available, Suction available and Patient being monitored Patient Re-evaluated:Patient Re-evaluated prior to induction Oxygen Delivery Method: Simple face mask

## 2020-11-28 NOTE — Interval H&P Note (Signed)
History and Physical Interval Note: 54/female with epigastric pain, mild acid reflux, intermittent diarrhea and screening colonoscopy with propofol.  11/28/2020 8:00 AM  Sandra Kennedy  has presented today for EGD and colonoscopy, with the diagnosis of Screening, reflux, abd pain, intermittent diarrhea.  The various methods of treatment have been discussed with the patient and family. After consideration of risks, benefits and other options for treatment, the patient has consented to  Procedure(s): ESOPHAGOGASTRODUODENOSCOPY (EGD) WITH PROPOFOL (N/A) COLONOSCOPY WITH PROPOFOL (N/A) as a surgical intervention.  The patient's history has been reviewed, patient examined, no change in status, stable for surgery.  I have reviewed the patient's chart and labs.  Questions were answered to the patient's satisfaction.     Kerin Salen

## 2020-11-28 NOTE — Op Note (Signed)
[facility]: Sandra RavelSherri Montanaro Procedure Date: 11/28/2020 MRN: [id number] Attending MD: Kerin SalenArya Ceil Roderick , MD Date of Birth: 05/07/1966 CSN: [id number] Age: [age] Admit Type: Outpatient Procedure:                Upper GI endoscopy Indications:              Epigastric abdominal pain, Heartburn, Suspected                            gastro-esophageal reflux disease, intermittent                            diarrhea Providers:                Kerin SalenArya Alexander Aument, MD, Fayrene FearingLaura Turner, RN, Lawson Radararlene Davis,                            Technician, Nadene Rubinson Cochran Referring MD:             Johny BlamerWilliam Harris, MD Medicines:                Monitored Anesthesia Care Complications:            No immediate complications. Estimated blood loss:                            Minimal. Estimated Blood Loss:     Estimated blood loss was minimal. Procedure:                Pre-Anesthesia Assessment:                           - Prior to the procedure, a History and Physical                            was performed, and patient medications and                            allergies were reviewed. The patient's tolerance of                            previous anesthesia was also reviewed. The risks                            and benefits of the procedure and the sedation                            options and risks were discussed with the patient.                            All questions were answered, and informed consent                            was obtained. Prior Anticoagulants: The patient has                            taken no previous anticoagulant or antiplatelet  agents. ASA Grade Assessment: II - A patient with                            mild systemic disease. After reviewing the risks                            and benefits, the patient was deemed in                            satisfactory condition to undergo the procedure.                           After obtaining informed  consent, the endoscope was                            passed under direct vision. Throughout the                            procedure, the patient's blood pressure, pulse, and                            oxygen saturations were monitored continuously. The                            GIF-H190 ([id number]) Olympus gastroscope was                            introduced through the mouth, and advanced to the                            second part of duodenum. The upper GI endoscopy was                            accomplished without difficulty. The patient                            tolerated the procedure well. Scope In: Scope Out: Findings:      The examined esophagus was normal.      The Z-line was regular and was found 35 cm from the incisors.      A 4-5 cm hiatal hernia was present.      Diffuse mildly erythematous mucosa without bleeding was found in the       gastric body and in the gastric antrum. Biopsies were taken with a cold       forceps for Helicobacter pylori testing.      The cardia and gastric fundus were otherwise normal on retroflexion.      The examined duodenum was normal. Biopsies for histology were taken with       a cold forceps for evaluation of celiac disease. Impression:               - Normal esophagus.                           - Z-line regular, 35 cm from the incisors.                           -  4-5 cm hiatal hernia.                           - Erythematous mucosa in the gastric body and                            antrum. Biopsied.                           - Normal examined duodenum. Biopsied. Moderate Sedation:      Patient did not receive moderate sedation for this procedure, but       instead received monitored anesthesia care. Recommendation:           - Patient has a contact number available for                            emergencies. The signs and symptoms of potential                            delayed complications were discussed with the                             patient. Return to normal activities tomorrow.                            Written discharge instructions were provided to the                            patient.                           - Resume regular diet.                           - Continue present medications.                           - Await pathology results.                           - Aggressive anti reflux measures such as weight                            loss, elevate head end of the bed during sleep,                            avoid or limit caffeinated products and space last                            meal of the day and bedtime by at least 3 hours. Procedure Code(s):        --- Professional ---                           [id number], Esophagogastroduodenoscopy, flexible,  transoral; with biopsy, single or multiple Diagnosis Code(s):        --- Professional ---                           K44.9, Diaphragmatic hernia without obstruction or                            gangrene                           K31.89, Other diseases of stomach and duodenum                           R10.13, Epigastric pain                           R12, Heartburn CPT copyright 2019 American Medical Association. All rights reserved. The codes documented in this report are preliminary and upon coder review may  be revised to meet current compliance requirements. Kerin Salen, MD 11/28/2020 8:44:22 AM This report has been signed electronically. Number of Addenda: 0

## 2020-11-28 NOTE — Discharge Instructions (Signed)

## 2020-11-28 NOTE — Transfer of Care (Signed)
Immediate Anesthesia Transfer of Care Note  Patient: Sandra Kennedy  Procedure(s) Performed: ESOPHAGOGASTRODUODENOSCOPY (EGD) WITH PROPOFOL (N/A ) COLONOSCOPY WITH PROPOFOL (N/A ) BIOPSY  Patient Location: Endoscopy Unit  Anesthesia Type:MAC  Level of Consciousness: awake, alert , oriented and patient cooperative  Airway & Oxygen Therapy: Patient Spontanous Breathing and Patient connected to face mask  Post-op Assessment: Report given to RN and Post -op Vital signs reviewed and stable  Post vital signs: Reviewed and stable  Last Vitals:  Vitals Value Taken Time  BP 107/81 11/28/20 0840  Temp    Pulse 85 11/28/20 0841  Resp 18 11/28/20 0841  SpO2 100 % 11/28/20 0841  Vitals shown include unvalidated device data.  Last Pain:  Vitals:   11/28/20 0840  TempSrc:   PainSc: 0-No pain         Complications: No complications documented.

## 2020-12-01 ENCOUNTER — Encounter (HOSPITAL_COMMUNITY): Payer: Self-pay | Admitting: Gastroenterology

## 2020-12-01 LAB — SURGICAL PATHOLOGY

## 2021-01-01 DIAGNOSIS — R300 Dysuria: Secondary | ICD-10-CM | POA: Diagnosis not present

## 2021-01-11 ENCOUNTER — Encounter (HOSPITAL_BASED_OUTPATIENT_CLINIC_OR_DEPARTMENT_OTHER): Payer: BC Managed Care – PPO | Admitting: Internal Medicine

## 2021-01-11 DIAGNOSIS — G4710 Hypersomnia, unspecified: Secondary | ICD-10-CM

## 2021-01-11 DIAGNOSIS — G4733 Obstructive sleep apnea (adult) (pediatric): Secondary | ICD-10-CM

## 2021-01-11 DIAGNOSIS — R0683 Snoring: Secondary | ICD-10-CM

## 2021-01-13 ENCOUNTER — Ambulatory Visit (HOSPITAL_BASED_OUTPATIENT_CLINIC_OR_DEPARTMENT_OTHER): Payer: BC Managed Care – PPO | Attending: Internal Medicine | Admitting: Internal Medicine

## 2021-01-13 ENCOUNTER — Other Ambulatory Visit: Payer: Self-pay

## 2021-01-13 DIAGNOSIS — G4710 Hypersomnia, unspecified: Secondary | ICD-10-CM

## 2021-01-13 DIAGNOSIS — R0683 Snoring: Secondary | ICD-10-CM

## 2021-01-13 DIAGNOSIS — G4733 Obstructive sleep apnea (adult) (pediatric): Secondary | ICD-10-CM | POA: Insufficient documentation

## 2021-01-13 DIAGNOSIS — R0902 Hypoxemia: Secondary | ICD-10-CM | POA: Diagnosis not present

## 2021-01-14 DIAGNOSIS — G4733 Obstructive sleep apnea (adult) (pediatric): Secondary | ICD-10-CM | POA: Diagnosis not present

## 2021-01-16 DIAGNOSIS — G4733 Obstructive sleep apnea (adult) (pediatric): Secondary | ICD-10-CM | POA: Diagnosis not present

## 2021-01-16 DIAGNOSIS — J343 Hypertrophy of nasal turbinates: Secondary | ICD-10-CM | POA: Diagnosis not present

## 2021-01-19 NOTE — Procedures (Signed)
NAME: Sandra Kennedy DATE OF BIRTH:  September 27, 1965 MEDICAL RECORD NUMBER [id number]  LOCATION: [facility]  PHYSICIAN: Deretha Emory  DATE OF STUDY: 01/13/2021  SLEEP STUDY TYPE: Nocturnal Polysomnogram               REFERRING PHYSICIAN: Johny Blamer, MD  EPWORTH SLEEPINESS SCORE:  NA HEIGHT: 5\' 6"  (167.6 cm)  WEIGHT: 185 lb (83.9 kg)    Body mass index is 29.86 kg/m.  NECK SIZE: 13.5 in.  CLINICAL INFORMATION The patient was referred to the sleep center for evaluation of Obstructive Sleep Apnea. She had a polysomnogram dated 05/31/2017 that revealed an AHI of 56/h and RDI of 56/h. She could not tolerate CPAP. She ended up with mandibular advancement. She recently saw 07/31/2017 in the [facility] where we did a Home Sleep Apnea Test to check for residual OSA. Her AHI on that test was 29/hour. She was seen by [facility] at [facility]. He recommended an in-lab study to corroborate the HSAT findings.   MEDICATIONS No sleep medicine administered.[facility]  SLEEP STUDY TECHNIQUE A multi-channel overnight Polysomnography study was performed. The channels recorded and monitored were central and occipital EEG, electrooculogram (EOG), submentalis EMG (chin), nasal and oral airflow, thoracic and abdominal wall motion, anterior tibialis EMG, snore microphone, electrocardiogram, and a pulse oximetry.  TECHNICAL COMMENTS Comments added by Technician: NO REST ROOM VISTED. Patient had difficulty initiating sleep. Comments added by Scorer: N/A  SLEEP ARCHITECTURE The study was initiated at 9:53:31 PM and terminated at 5:32:09 AM. The total recorded time was 458.6 minutes. EEG confirmed total sleep time was 360 minutes yielding a sleep efficiency of 78.5%. Sleep onset after lights out was 77.9 minutes with a REM latency of 140.0 minutes. The patient spent 6.5% of the night in stage N1 sleep, 80.4% in stage N2 sleep, 0.0% in stage N3 and 13.1% in REM. Wake after sleep onset (WASO) was 20.8  minutes. The Arousal Index was 40.8/hour.  RESPIRATORY PARAMETERS There were a total of 167 respiratory disturbances out of which 32 were apneas ( 32 obstructive, 0 mixed, 0 central) and 135 hypopneas. The apnea/hypopnea index (AHI) was 27.8 events/hour. The central sleep apnea index was 0 events/hour. The REM AHI was 75.3 events/hour and NREM AHI was 20.7 events/hour. The supine AHI was 28.2 events/hour and the non supine AHI was 27 events/hour. She was supine during 72.22% of sleep. Respiratory disturbance index was 36 events/hour overall and 79 events/hour in REM sleep.  Respiratory disturbances were associated with oxygen desaturation down to a nadir of 70.0% during sleep. The mean oxygen saturation during the study was 92.9%. The cumulative time under 88% oxygen saturation was 28 minutes.  LEG MOVEMENT DATA The total leg movements were 0 with a resulting leg movement index of 0.0/hr . Associated arousal with leg movement index was 0.0/hr.  CARDIAC DATA The underlying cardiac rhythm was most consistent with sinus rhythm. Mean heart rate during sleep was 68.4 bpm. Additional rhythm abnormalities include None.  IMPRESSIONS - Moderate Obstructive Sleep Apnea (OSA) by AHI with correlates almost perfectly with Home Sleep Apnea Testing results. By Respiratory Disturbance Index, she has Severe OSA.  - Severe hypoxemia  - No significant periodic leg movements(PLMs) during sleep.   DIAGNOSIS - Obstructive Sleep Apnea (G47.33) - Nocturnal Hypoxemia (G47.36)  RECOMMENDATIONS - This study will be sent to Dr. Marland Kitchen at [facility] to help guide further recommendations.  [facility] Sleep specialist,  American Board of Internal Medicine  ELECTRONICALLY SIGNED ON:  01/19/2021, 8:16 PM [facility] PH: [phone]   FX: [phone] ACCREDITED BY THE AMERICAN ACADEMY OF SLEEP MEDICINE

## 2021-01-20 DIAGNOSIS — G4733 Obstructive sleep apnea (adult) (pediatric): Secondary | ICD-10-CM | POA: Diagnosis not present

## 2021-01-20 DIAGNOSIS — M2604 Mandibular hypoplasia: Secondary | ICD-10-CM | POA: Diagnosis not present

## 2021-01-20 DIAGNOSIS — M2602 Maxillary hypoplasia: Secondary | ICD-10-CM | POA: Diagnosis not present

## 2021-01-20 DIAGNOSIS — M2606 Microgenia: Secondary | ICD-10-CM | POA: Diagnosis not present

## 2021-04-13 DIAGNOSIS — G4733 Obstructive sleep apnea (adult) (pediatric): Secondary | ICD-10-CM | POA: Diagnosis not present

## 2021-07-29 DIAGNOSIS — R7303 Prediabetes: Secondary | ICD-10-CM | POA: Diagnosis not present

## 2021-07-29 DIAGNOSIS — E7800 Pure hypercholesterolemia, unspecified: Secondary | ICD-10-CM | POA: Diagnosis not present

## 2021-07-29 DIAGNOSIS — H9313 Tinnitus, bilateral: Secondary | ICD-10-CM | POA: Diagnosis not present

## 2021-07-29 DIAGNOSIS — Z23 Encounter for immunization: Secondary | ICD-10-CM | POA: Diagnosis not present

## 2021-07-29 DIAGNOSIS — F419 Anxiety disorder, unspecified: Secondary | ICD-10-CM | POA: Diagnosis not present

## 2021-08-05 DIAGNOSIS — Z1231 Encounter for screening mammogram for malignant neoplasm of breast: Secondary | ICD-10-CM | POA: Diagnosis not present

## 2021-08-05 DIAGNOSIS — Z01419 Encounter for gynecological examination (general) (routine) without abnormal findings: Secondary | ICD-10-CM | POA: Diagnosis not present

## 2021-08-05 DIAGNOSIS — E8941 Symptomatic postprocedural ovarian failure: Secondary | ICD-10-CM | POA: Diagnosis not present

## 2021-08-05 DIAGNOSIS — Z6830 Body mass index (BMI) 30.0-30.9, adult: Secondary | ICD-10-CM | POA: Diagnosis not present

## 2021-09-03 ENCOUNTER — Other Ambulatory Visit: Payer: Self-pay | Admitting: Obstetrics and Gynecology

## 2021-09-03 DIAGNOSIS — Z8249 Family history of ischemic heart disease and other diseases of the circulatory system: Secondary | ICD-10-CM

## 2021-09-18 DIAGNOSIS — R251 Tremor, unspecified: Secondary | ICD-10-CM | POA: Diagnosis not present

## 2021-09-18 DIAGNOSIS — F419 Anxiety disorder, unspecified: Secondary | ICD-10-CM | POA: Diagnosis not present

## 2021-09-18 DIAGNOSIS — R519 Headache, unspecified: Secondary | ICD-10-CM | POA: Diagnosis not present

## 2022-03-04 DIAGNOSIS — G4733 Obstructive sleep apnea (adult) (pediatric): Secondary | ICD-10-CM | POA: Diagnosis not present

## 2022-04-06 DIAGNOSIS — H11442 Conjunctival cysts, left eye: Secondary | ICD-10-CM | POA: Diagnosis not present

## 2022-04-30 DIAGNOSIS — R7303 Prediabetes: Secondary | ICD-10-CM | POA: Diagnosis not present

## 2022-04-30 DIAGNOSIS — F419 Anxiety disorder, unspecified: Secondary | ICD-10-CM | POA: Diagnosis not present

## 2022-04-30 DIAGNOSIS — E7800 Pure hypercholesterolemia, unspecified: Secondary | ICD-10-CM | POA: Diagnosis not present

## 2022-04-30 DIAGNOSIS — R519 Headache, unspecified: Secondary | ICD-10-CM | POA: Diagnosis not present

## 2022-05-17 DIAGNOSIS — F419 Anxiety disorder, unspecified: Secondary | ICD-10-CM | POA: Diagnosis not present

## 2022-05-17 DIAGNOSIS — G4733 Obstructive sleep apnea (adult) (pediatric): Secondary | ICD-10-CM | POA: Diagnosis not present

## 2022-05-17 DIAGNOSIS — K219 Gastro-esophageal reflux disease without esophagitis: Secondary | ICD-10-CM | POA: Diagnosis not present

## 2022-06-23 DIAGNOSIS — G4736 Sleep related hypoventilation in conditions classified elsewhere: Secondary | ICD-10-CM | POA: Diagnosis not present

## 2022-06-23 DIAGNOSIS — G4733 Obstructive sleep apnea (adult) (pediatric): Secondary | ICD-10-CM | POA: Diagnosis not present

## 2022-07-27 DIAGNOSIS — K219 Gastro-esophageal reflux disease without esophagitis: Secondary | ICD-10-CM | POA: Diagnosis not present

## 2022-07-27 DIAGNOSIS — G4733 Obstructive sleep apnea (adult) (pediatric): Secondary | ICD-10-CM | POA: Diagnosis not present

## 2022-07-27 DIAGNOSIS — F419 Anxiety disorder, unspecified: Secondary | ICD-10-CM | POA: Diagnosis not present

## 2022-08-10 DIAGNOSIS — K429 Umbilical hernia without obstruction or gangrene: Secondary | ICD-10-CM | POA: Diagnosis not present

## 2022-08-10 DIAGNOSIS — K449 Diaphragmatic hernia without obstruction or gangrene: Secondary | ICD-10-CM | POA: Diagnosis not present

## 2022-09-02 DIAGNOSIS — K449 Diaphragmatic hernia without obstruction or gangrene: Secondary | ICD-10-CM | POA: Diagnosis not present

## 2022-09-02 DIAGNOSIS — K429 Umbilical hernia without obstruction or gangrene: Secondary | ICD-10-CM | POA: Diagnosis not present

## 2022-09-02 DIAGNOSIS — G4733 Obstructive sleep apnea (adult) (pediatric): Secondary | ICD-10-CM | POA: Diagnosis not present

## 2022-09-22 NOTE — Progress Notes (Signed)
Sent message, via epic in basket, requesting orders in epic from surgeon.  

## 2022-09-24 ENCOUNTER — Ambulatory Visit: Payer: Self-pay | Admitting: General Surgery

## 2022-09-29 ENCOUNTER — Encounter (HOSPITAL_COMMUNITY)
Admission: RE | Admit: 2022-09-29 | Discharge: 2022-09-29 | Disposition: A | Discharge status: Home / Self Care | Payer: BC Managed Care – PPO | Source: Ambulatory Visit | Attending: Family Medicine | Admitting: Family Medicine

## 2022-10-04 ENCOUNTER — Encounter (HOSPITAL_COMMUNITY): Admission: RE | Admit: 2022-10-04 | Payer: BC Managed Care – PPO | Source: Ambulatory Visit

## 2022-10-08 NOTE — Progress Notes (Addendum)
Patient had some questions about surgery. Need to sign consent DOS.  COVID Vaccine Completed: yes  Date of COVID positive in last 90 days: no  PCP - Shirline Frees, MD Cardiologist - n/a  Chest x-ray - n/a EKG - n/a Stress Test - n/a ECHO - n/a Cardiac Cath - n/a Pacemaker/ICD device last checked: n/a Spinal Cord Stimulator: n/a  Bowel Prep - no  Sleep Study - yes CPAP -  yes every night  Fasting Blood Sugar - n/a Checks Blood Sugar _____ times a day  Last dose of GLP1 agonist-  N/A GLP1 instructions:  N/A   Last dose of SGLT-2 inhibitors-  N/A SGLT-2 instructions: N/A   Blood Thinner Instructions: n/a Aspirin Instructions: Last Dose:  Activity level: Can go up a flight of stairs and perform activities of daily living without stopping and without symptoms of chest pain or shortness of breath.   Anesthesia review:   Patient denies shortness of breath, fever, cough and chest pain at PAT appointment  Patient verbalized understanding of instructions that were given to them at the PAT appointment. Patient was also instructed that they will need to review over the PAT instructions again at home before surgery.

## 2022-10-11 ENCOUNTER — Encounter (HOSPITAL_COMMUNITY): Payer: Self-pay

## 2022-10-11 ENCOUNTER — Encounter (HOSPITAL_COMMUNITY)
Admission: RE | Admit: 2022-10-11 | Discharge: 2022-10-11 | Disposition: A | Discharge status: Home / Self Care | Payer: BC Managed Care – PPO | Source: Ambulatory Visit | Attending: General Surgery | Admitting: General Surgery

## 2022-10-11 VITALS — BP 134/94 | HR 75 | Temp 97.9°F | Resp 16 | Ht 66.25 in | Wt 192.0 lb

## 2022-10-11 DIAGNOSIS — K219 Gastro-esophageal reflux disease without esophagitis: Secondary | ICD-10-CM | POA: Diagnosis not present

## 2022-10-11 DIAGNOSIS — G4730 Sleep apnea, unspecified: Secondary | ICD-10-CM | POA: Diagnosis not present

## 2022-10-11 DIAGNOSIS — K420 Umbilical hernia with obstruction, without gangrene: Secondary | ICD-10-CM | POA: Diagnosis not present

## 2022-10-11 DIAGNOSIS — Z01812 Encounter for preprocedural laboratory examination: Secondary | ICD-10-CM | POA: Insufficient documentation

## 2022-10-11 DIAGNOSIS — Z01818 Encounter for other preprocedural examination: Secondary | ICD-10-CM

## 2022-10-11 HISTORY — DX: Tremor, unspecified: R25.1

## 2022-10-11 LAB — CBC
HCT: 46.1 % — ABNORMAL HIGH (ref 36.0–46.0)
Hemoglobin: 14.7 g/dL (ref 12.0–15.0)
MCH: 28.1 pg (ref 26.0–34.0)
MCHC: 31.9 g/dL (ref 30.0–36.0)
MCV: 88.1 fL (ref 80.0–100.0)
Platelets: 335 10*3/uL (ref 150–400)
RBC: 5.23 MIL/uL — ABNORMAL HIGH (ref 3.87–5.11)
RDW: 12.7 % (ref 11.5–15.5)
WBC: 8.1 10*3/uL (ref 4.0–10.5)
nRBC: 0 % (ref 0.0–0.2)

## 2022-10-11 NOTE — Patient Instructions (Signed)
SURGICAL WAITING ROOM VISITATION  Patients having surgery or a procedure may have no more than 2 support people in the waiting area - these visitors may rotate.    Children under the age of 15 must have an adult with them who is not the patient.  Due to an increase in RSV and influenza rates and associated hospitalizations, children ages 107 and under may not visit patients in [place].  If the patient needs to stay at the hospital during part of their recovery, the visitor guidelines for inpatient rooms apply. Pre-op nurse will coordinate an appropriate time for 1 support person to accompany patient in pre-op.  This support person may not rotate.    Please refer to the [facility] website for the visitor guidelines for Inpatients (after your surgery is over and you are in a regular room).    Your procedure is scheduled on: 10/14/22   Report to [facility] Main Entrance    Report to admitting at 6:45 AM   Call this number if you have problems the morning of surgery [phone]   Do not eat food :After Midnight.   After Midnight you may have the following liquids until 6:00 AM DAY OF SURGERY  Water Non-Citrus Juices (without pulp, NO RED-Apple, White grape, White cranberry) Black Coffee (NO MILK/CREAM OR CREAMERS, sugar ok)  Clear Tea (NO MILK/CREAM OR CREAMERS, sugar ok) regular and decaf                             Plain Jell-O (NO RED)                                           Fruit ices (not with fruit pulp, NO RED)                                     Popsicles (NO RED)                                                               Sports drinks like Gatorade (NO RED)                 The day of surgery:  Drink ONE (1) Pre-Surgery Clear Ensure at 6:00 AM the morning of surgery. Drink in one sitting. Do not sip.  This drink was given to you during your hospital  pre-op appointment visit. Nothing else to drink after completing the  Pre-Surgery Clear  Ensure.          If you have questions, please contact your surgeon's office.   FOLLOW BOWEL PREP AND ANY ADDITIONAL PRE OP INSTRUCTIONS YOU RECEIVED FROM YOUR SURGEON'S OFFICE!!!     Oral Hygiene is also important to reduce your risk of infection.                                    Remember - BRUSH YOUR TEETH THE MORNING OF SURGERY WITH YOUR REGULAR TOOTHPASTE  DENTURES WILL  BE REMOVED PRIOR TO SURGERY PLEASE DO NOT APPLY "Poly grip" OR ADHESIVES!!!   Take these medicines the morning of surgery with A SIP OF WATER: Norco, Propranolol   Bring CPAP mask and tubing day of surgery.                              You may not have any metal on your body including hair pins, jewelry, and body piercing             Do not wear make-up, lotions, powders, perfumes, or deodorant  Do not wear nail polish including gel and S&S, artificial/acrylic nails, or any other type of covering on natural nails including finger and toenails. If you have artificial nails, gel coating, etc. that needs to be removed by a nail salon please have this removed prior to surgery or surgery may need to be canceled/ delayed if the surgeon/ anesthesia feels like they are unable to be safely monitored.   Do not shave  48 hours prior to surgery.    Do not bring valuables to the hospital. [place].   Contacts, glasses, dentures or bridgework may not be worn into surgery.  DO NOT May. PHARMACY WILL DISPENSE MEDICATIONS LISTED ON YOUR MEDICATION LIST TO YOU DURING YOUR ADMISSION [place]!    Patients discharged on the day of surgery will not be allowed to drive home.  Someone NEEDS to stay with you for the first 24 hours after anesthesia.   Special Instructions: Bring a copy of your healthcare power of attorney and living will documents the day of surgery if you haven't scanned them before.              Please read over the  following fact sheets you were given: IF [place] [phone] Schneiders    If you received a COVID test during your pre-op visit  it is requested that you wear a mask when out in public, stay away from anyone that may not be feeling well and notify your surgeon if you develop symptoms. If you test positive for Covid or have been in contact with anyone that has tested positive in the last 10 days please notify you surgeon.    [place] - Preparing for Surgery Before surgery, you can play an important role.  Because skin is not sterile, your skin needs to be as free of germs as possible.  You can reduce the number of germs on your skin by washing with CHG (chlorahexidine gluconate) soap before surgery.  CHG is an antiseptic cleaner which kills germs and bonds with the skin to continue killing germs even after washing. Please DO NOT use if you have an allergy to CHG or antibacterial soaps.  If your skin becomes reddened/irritated stop using the CHG and inform your nurse when you arrive at Short Stay. Do not shave (including legs and underarms) for at least 48 hours prior to the first CHG shower.  You may shave your face/neck.  Please follow these instructions carefully:  1.  Shower with CHG Soap the night before surgery and the  morning of surgery.  2.  If you choose to wash your hair, wash your hair first as usual with your normal  shampoo.  3.  After you shampoo, rinse your hair and body thoroughly to remove the shampoo.                             4.  Use CHG as you would any other liquid soap.  You can apply chg directly to the skin and wash.  Gently with a scrungie or clean washcloth.  5.  Apply the CHG Soap to your body ONLY FROM THE NECK DOWN.   Do   not use on face/ open                           Wound or open sores. Avoid contact with eyes, ears mouth and   genitals (private parts).                       Wash face,  Genitals (private parts)  with your normal soap.             6.  Wash thoroughly, paying special attention to the area where your    surgery  will be performed.  7.  Thoroughly rinse your body with warm water from the neck down.  8.  DO NOT shower/wash with your normal soap after using and rinsing off the CHG Soap.                9.  Pat yourself dry with a clean towel.            10.  Wear clean pajamas.            11.  Place clean sheets on your bed the night of your first shower and do not  sleep with pets. Day of Surgery : Do not apply any lotions/deodorants the morning of surgery.  Please wear clean clothes to the hospital/surgery center.  FAILURE TO FOLLOW THESE INSTRUCTIONS MAY RESULT IN THE CANCELLATION OF YOUR SURGERY  PATIENT SIGNATURE_________________________________  NURSE SIGNATURE__________________________________  ________________________________________________________________________

## 2022-10-13 ENCOUNTER — Encounter (HOSPITAL_COMMUNITY): Payer: Self-pay | Admitting: General Surgery

## 2022-10-14 ENCOUNTER — Ambulatory Visit (HOSPITAL_COMMUNITY): Payer: BC Managed Care – PPO | Admitting: Anesthesiology

## 2022-10-14 ENCOUNTER — Encounter (HOSPITAL_COMMUNITY): Payer: Self-pay | Admitting: General Surgery

## 2022-10-14 ENCOUNTER — Ambulatory Visit (HOSPITAL_COMMUNITY)
Admission: RE | Admit: 2022-10-14 | Discharge: 2022-10-14 | Disposition: A | Discharge status: Home / Self Care | Payer: BC Managed Care – PPO | Attending: General Surgery | Admitting: General Surgery

## 2022-10-14 ENCOUNTER — Encounter (HOSPITAL_COMMUNITY)
Admission: RE | Disposition: A | Discharge status: Home / Self Care | Payer: Self-pay | Source: Home / Self Care | Attending: General Surgery

## 2022-10-14 ENCOUNTER — Other Ambulatory Visit: Payer: Self-pay

## 2022-10-14 ENCOUNTER — Ambulatory Visit (HOSPITAL_COMMUNITY): Payer: BC Managed Care – PPO | Admitting: Physician Assistant

## 2022-10-14 DIAGNOSIS — K219 Gastro-esophageal reflux disease without esophagitis: Secondary | ICD-10-CM | POA: Diagnosis not present

## 2022-10-14 DIAGNOSIS — K420 Umbilical hernia with obstruction, without gangrene: Secondary | ICD-10-CM | POA: Diagnosis not present

## 2022-10-14 DIAGNOSIS — G4730 Sleep apnea, unspecified: Secondary | ICD-10-CM | POA: Diagnosis not present

## 2022-10-14 HISTORY — PX: UMBILICAL HERNIA REPAIR: SHX196

## 2022-10-14 LAB — POCT PREGNANCY, URINE: Preg Test, Ur: NEGATIVE

## 2022-10-14 SURGERY — REPAIR, HERNIA, UMBILICAL, ADULT
Anesthesia: General

## 2022-10-14 MED ORDER — KETOROLAC TROMETHAMINE 30 MG/ML IJ SOLN
INTRAMUSCULAR | Status: DC | PRN
  Administered 2022-10-14: 30 mg via INTRAVENOUS

## 2022-10-14 MED ORDER — BUPIVACAINE LIPOSOME 1.3 % IJ SUSP
INTRAMUSCULAR | Status: AC
  Filled 2022-10-14: qty 20

## 2022-10-14 MED ORDER — ORAL CARE MOUTH RINSE: 15.0000 mL | Freq: Once | OROMUCOSAL | Status: AC

## 2022-10-14 MED ORDER — PROPOFOL 10 MG/ML IV BOLUS
INTRAVENOUS | Status: AC
  Filled 2022-10-14: qty 20

## 2022-10-14 MED ORDER — FENTANYL CITRATE (PF) 100 MCG/2ML IJ SOLN
INTRAMUSCULAR | Status: DC | PRN
  Administered 2022-10-14: 50 ug via INTRAVENOUS
  Administered 2022-10-14: 100 ug via INTRAVENOUS

## 2022-10-14 MED ORDER — LACTATED RINGERS IV SOLN: INTRAVENOUS | Status: DC

## 2022-10-14 MED ORDER — SCOPOLAMINE 1 MG/3DAYS TD PT72
1.0000 | MEDICATED_PATCH | TRANSDERMAL | Status: DC
  Administered 2022-10-14: 1.5 mg via TRANSDERMAL
  Filled 2022-10-14: qty 1

## 2022-10-14 MED ORDER — FENTANYL CITRATE (PF) 100 MCG/2ML IJ SOLN
INTRAMUSCULAR | Status: AC
  Filled 2022-10-14: qty 2

## 2022-10-14 MED ORDER — ONDANSETRON HCL 4 MG/2ML IJ SOLN
INTRAMUSCULAR | Status: AC
  Filled 2022-10-14: qty 6

## 2022-10-14 MED ORDER — EPHEDRINE SULFATE-NACL 50-0.9 MG/10ML-% IV SOSY
PREFILLED_SYRINGE | INTRAVENOUS | Status: DC | PRN
  Administered 2022-10-14: 5 mg via INTRAVENOUS

## 2022-10-14 MED ORDER — ROCURONIUM BROMIDE 10 MG/ML (PF) SYRINGE
PREFILLED_SYRINGE | INTRAVENOUS | Status: AC
  Filled 2022-10-14: qty 30

## 2022-10-14 MED ORDER — AMISULPRIDE (ANTIEMETIC) 5 MG/2ML IV SOLN: 10.0000 mg | Freq: Once | INTRAVENOUS | Status: DC | PRN

## 2022-10-14 MED ORDER — DEXAMETHASONE SODIUM PHOSPHATE 10 MG/ML IJ SOLN
INTRAMUSCULAR | Status: DC | PRN
  Administered 2022-10-14: 8 mg via INTRAVENOUS

## 2022-10-14 MED ORDER — CHLORHEXIDINE GLUCONATE CLOTH 2 % EX PADS: 6.0000 | MEDICATED_PAD | Freq: Once | CUTANEOUS | Status: DC

## 2022-10-14 MED ORDER — CHLORHEXIDINE GLUCONATE 0.12 % MT SOLN
15.0000 mL | Freq: Once | OROMUCOSAL | Status: AC
  Administered 2022-10-14: 15 mL via OROMUCOSAL

## 2022-10-14 MED ORDER — OXYCODONE HCL 5 MG PO TABS: 5.0000 mg | ORAL_TABLET | Freq: Four times a day (QID) | ORAL | 0 refills | Status: AC | PRN

## 2022-10-14 MED ORDER — ACETAMINOPHEN 500 MG PO TABS: 1000.0000 mg | ORAL_TABLET | Freq: Once | ORAL | Status: DC

## 2022-10-14 MED ORDER — FENTANYL CITRATE PF 50 MCG/ML IJ SOSY: 25.0000 ug | PREFILLED_SYRINGE | INTRAMUSCULAR | Status: DC | PRN

## 2022-10-14 MED ORDER — MIDAZOLAM HCL 2 MG/2ML IJ SOLN
INTRAMUSCULAR | Status: AC
  Filled 2022-10-14: qty 2

## 2022-10-14 MED ORDER — DEXAMETHASONE SODIUM PHOSPHATE 10 MG/ML IJ SOLN
INTRAMUSCULAR | Status: AC
  Filled 2022-10-14: qty 3

## 2022-10-14 MED ORDER — LIDOCAINE 2% (20 MG/ML) 5 ML SYRINGE
INTRAMUSCULAR | Status: DC | PRN
  Administered 2022-10-14: 80 mg via INTRAVENOUS

## 2022-10-14 MED ORDER — SODIUM CHLORIDE 0.9 % IR SOLN
Status: DC | PRN
  Administered 2022-10-14: 1000 mL

## 2022-10-14 MED ORDER — PROMETHAZINE HCL 25 MG/ML IJ SOLN: 6.2500 mg | INTRAMUSCULAR | Status: DC | PRN

## 2022-10-14 MED ORDER — MIDAZOLAM HCL 5 MG/5ML IJ SOLN
INTRAMUSCULAR | Status: DC | PRN
  Administered 2022-10-14: 2 mg via INTRAVENOUS

## 2022-10-14 MED ORDER — BUPIVACAINE LIPOSOME 1.3 % IJ SUSP
INTRAMUSCULAR | Status: DC | PRN
  Administered 2022-10-14: 20 mL

## 2022-10-14 MED ORDER — BUPIVACAINE HCL (PF) 0.25 % IJ SOLN
INTRAMUSCULAR | Status: AC
  Filled 2022-10-14: qty 30

## 2022-10-14 MED ORDER — ACETAMINOPHEN 500 MG PO TABS: 1000.0000 mg | ORAL_TABLET | Freq: Three times a day (TID) | ORAL | 0 refills | Status: AC

## 2022-10-14 MED ORDER — LIDOCAINE HCL (PF) 2 % IJ SOLN
INTRAMUSCULAR | Status: AC
  Filled 2022-10-14: qty 15

## 2022-10-14 MED ORDER — ACETAMINOPHEN 500 MG PO TABS
1000.0000 mg | ORAL_TABLET | ORAL | Status: AC
  Administered 2022-10-14: 1000 mg via ORAL
  Filled 2022-10-14: qty 2

## 2022-10-14 MED ORDER — BUPIVACAINE HCL 0.25 % IJ SOLN
INTRAMUSCULAR | Status: DC | PRN
  Administered 2022-10-14: 30 mL

## 2022-10-14 MED ORDER — PROPOFOL 10 MG/ML IV BOLUS
INTRAVENOUS | Status: DC | PRN
  Administered 2022-10-14: 150 mg via INTRAVENOUS

## 2022-10-14 MED ORDER — CEFAZOLIN SODIUM-DEXTROSE 2-4 GM/100ML-% IV SOLN
2.0000 g | INTRAVENOUS | Status: AC
  Administered 2022-10-14: 2 g via INTRAVENOUS
  Filled 2022-10-14: qty 100

## 2022-10-14 MED ORDER — ONDANSETRON HCL 4 MG/2ML IJ SOLN
INTRAMUSCULAR | Status: DC | PRN
  Administered 2022-10-14: 4 mg via INTRAVENOUS

## 2022-10-14 SURGICAL SUPPLY — 41 items
ADH SKN CLS APL DERMABOND .7 (GAUZE/BANDAGES/DRESSINGS) ×1
APL SKNCLS STERI-STRIP NONHPOA (GAUZE/BANDAGES/DRESSINGS)
BAG COUNTER SPONGE SURGICOUNT (BAG) IMPLANT
BAG SPNG CNTER NS LX DISP (BAG)
BENZOIN TINCTURE PRP APPL 2/3 (GAUZE/BANDAGES/DRESSINGS) IMPLANT
COVER SURGICAL LIGHT HANDLE (MISCELLANEOUS) ×2 IMPLANT
DERMABOND ADVANCED .7 DNX12 (GAUZE/BANDAGES/DRESSINGS) IMPLANT
DRAIN CHANNEL 19F RND (DRAIN) IMPLANT
DRAPE LAPAROTOMY T 98X78 PEDS (DRAPES) IMPLANT
ELECT REM PT RETURN 15FT ADLT (MISCELLANEOUS) ×2 IMPLANT
EVACUATOR SILICONE 100CC (DRAIN) IMPLANT
GAUZE SPONGE 4X4 12PLY STRL (GAUZE/BANDAGES/DRESSINGS) IMPLANT
GLOVE BIO SURGEON STRL SZ7.5 (GLOVE) ×2 IMPLANT
GLOVE INDICATOR 8.0 STRL GRN (GLOVE) ×2 IMPLANT
GOWN STRL REUS W/ TWL XL LVL3 (GOWN DISPOSABLE) ×4 IMPLANT
GOWN STRL REUS W/TWL XL LVL3 (GOWN DISPOSABLE) ×2
KIT BASIN OR (CUSTOM PROCEDURE TRAY) ×2 IMPLANT
KIT TURNOVER KIT A (KITS) IMPLANT
NEEDLE HYPO 22GX1.5 SAFETY (NEEDLE) IMPLANT
NS IRRIG 1000ML POUR BTL (IV SOLUTION) ×2 IMPLANT
PACK GENERAL/GYN (CUSTOM PROCEDURE TRAY) ×2 IMPLANT
SPIKE FLUID TRANSFER (MISCELLANEOUS) ×2 IMPLANT
STAPLER VISISTAT 35W (STAPLE) IMPLANT
STRIP CLOSURE SKIN 1/2X4 (GAUZE/BANDAGES/DRESSINGS) IMPLANT
SUT ETHILON 2 0 PS N (SUTURE) IMPLANT
SUT MNCRL AB 4-0 PS2 18 (SUTURE) IMPLANT
SUT NOVA 0 T19/GS 22DT (SUTURE) IMPLANT
SUT NOVA 1 T20/GS 25DT (SUTURE) IMPLANT
SUT NOVA NAB DX-16 0-1 5-0 T12 (SUTURE) IMPLANT
SUT PDS AB 1 TP1 96 (SUTURE) IMPLANT
SUT PROLENE 0 CT 1 CR/8 (SUTURE) IMPLANT
SUT VIC AB 2-0 SH 27 (SUTURE)
SUT VIC AB 2-0 SH 27X BRD (SUTURE) IMPLANT
SUT VIC AB 3-0 SH 18 (SUTURE) ×2 IMPLANT
SUT VIC AB 3-0 SH 27 (SUTURE)
SUT VIC AB 3-0 SH 27X BRD (SUTURE) IMPLANT
SYR CONTROL 10ML LL (SYRINGE) IMPLANT
TOWEL OR 17X26 10 PK STRL BLUE (TOWEL DISPOSABLE) ×2 IMPLANT
TOWEL OR NON WOVEN STRL DISP B (DISPOSABLE) ×2 IMPLANT
TRAY FOLEY MTR SLVR 14FR STAT (SET/KITS/TRAYS/PACK) IMPLANT
TRAY FOLEY MTR SLVR 16FR STAT (SET/KITS/TRAYS/PACK) IMPLANT

## 2022-10-14 NOTE — Anesthesia Preprocedure Evaluation (Addendum)
Anesthesia Evaluation  Patient identified by MRN, date of birth, ID band Patient awake    Reviewed: Allergy & Precautions, NPO status , Patient's Chart, lab work & pertinent test results  History of Anesthesia Complications Negative for: history of anesthetic complications  Airway Mallampati: II  TM Distance: >3 FB Neck ROM: Full    Dental no notable dental hx. (+) Dental Advisory Given   Pulmonary sleep apnea (s/p surgery, awaiting retesting prior to possible CPAP)    Pulmonary exam normal        Cardiovascular negative cardio ROS Normal cardiovascular exam     Neuro/Psych  PSYCHIATRIC DISORDERS Anxiety Depression    negative neurological ROS     GI/Hepatic Neg liver ROS,GERD  Medicated and Controlled,,  Endo/Other  negative endocrine ROS    Renal/GU negative Renal ROS     Musculoskeletal negative musculoskeletal ROS (+)    Abdominal   Peds  Hematology negative hematology ROS (+)   Anesthesia Other Findings Covid test negative   Reproductive/Obstetrics                             Anesthesia Physical Anesthesia Plan  ASA: 2  Anesthesia Plan: General   Post-op Pain Management: Tylenol PO (pre-op)* and Toradol IV (intra-op)*   Induction: Intravenous  PONV Risk Score and Plan: 2 and 4 or greater and Ondansetron, Dexamethasone, Midazolam and Scopolamine patch - Pre-op  Airway Management Planned: Oral ETT and LMA  Additional Equipment: None  Intra-op Plan:   Post-operative Plan: Extubation in OR  Informed Consent: I have reviewed the patients History and Physical, chart, labs and discussed the procedure including the risks, benefits and alternatives for the proposed anesthesia with the patient or authorized representative who has indicated his/her understanding and acceptance.     Dental advisory given  Plan Discussed with: Anesthesiologist and CRNA  Anesthesia Plan  Comments:         Anesthesia Quick Evaluation

## 2022-10-14 NOTE — Op Note (Signed)
Open repair of incarcerated Umbilical Hernia   Indications: Symptomatic incarcerated umbilical hernia   Pre-operative Diagnosis: incarcerated umbilical hernia   Post-operative Diagnosis: incarcerated (omentum) umbilical hernia   Surgeon: Leighton Ruff. Redmond Pulling, MD FACS   Assistants: none   Anesthesia: General LMA anesthesia and Local anesthesia 0.25%marcaine with exparel   Procedure Details  The patient was seen in the Holding Room. The risks, benefits, complications, treatment options, and expected outcomes were discussed with the patient. The possibilities of reaction to medication, pulmonary aspiration, perforation of viscus, bleeding, recurrent infection, hernia recurrence, the need for additional procedures, failure to diagnose a condition, and creating a complication requiring transfusion or operation were discussed with the patient. The patient concurred with the proposed plan, giving informed consent. The site of surgery properly noted/marked.   The patient was taken to Operating Room # 1 at [facility], identified as Sandra Kennedy and the procedure verified as Umbilical Herniorrhaphy. A Time Out was held and the above information confirmed. The patient received IV antibiotics and oral tylenol  prior to the procedure.   The patient was placed supine. After establishing general LMA anesthesia, the abdomen was prepped and draped in standard fashion.  A curvilinear infraumbilical incision was created. Dissection was carried down to the hernia sac located above the fascia and was mobilized from surrounding structures. Hernia contained incarcerated omentum.  I was unable to reduce it prior to skin incision. The fascial defect was approximately 0.6cm. I was unable to reduce the incarcerated omentum back into the abdomen so I transected/amputated the protruding omentum with electrocautery. Remaining omentum I was able to reduce back into the abdominal cavity.   Intact fascia was identified  circumferentially around the defect. Skin and soft tissue was mobilized from the surface of the fascia in a circumferential manner. A sweep with the backend of a forcep was performed underneath the fascia to ensure there were no adhesions to the anterior abdominal wall. Since the defect was only 0.6cm I didn't place mesh.   The fascia was then closed transversely with 4 interrupted 0-novafil sutures. The cavity was irrigated and additional local was infiltrated in the subcutaneous tissue and fascia. The umbilical stalk was then tacked back down to the fascia with one 3-0 vicryl sutures. Hemostasis was confirmed. The soft tissue was irrigated and closed in layers with inverted interrupted 3-0 vicryl sutures for the deep dermis. The skin incision was closed with a 4-0 monocryl subcuticular closure. Dermabond was applied at the end of the operation.   Instrument, sponge, and needle counts were correct prior to closure and at the conclusion of the case.   Findings:  A 0.6 cm fascial defect  Incarcerated with omentum  Defect size didn't change after reduction/amputation of omentum  Estimated Blood Loss: Minimal   Drains: none   Implants: none   Complications: None; patient tolerated the procedure well.   Disposition: PACU - hemodynamically stable.   Condition: stable  Leighton Ruff. Redmond Pulling, MD, FACS General, Bariatric, & Minimally Invasive Surgery [facility], [place]

## 2022-10-14 NOTE — H&P (Signed)
CC: here for surgery  Requesting provider: dr sun  HPI: Sandra Kennedy is an 57 y.o. female who is here for open repair of umbilical hernia with poss mesh. Denies medical changes since seen in clinic.   Old hpi: Sandra Kennedy is a 58 y.o. female who is seen today as an office consultation at the request of Dr. Nancy Fetter at [place] at [place] for evaluation of new patient (Umbilical hernia ) .   She saw her PCP on November 21 with complaints of a few day history of abdominal pain above her bellybutton. It was not associated with any nausea or vomiting, alterations in bowel movements fever or chills. She was referred here for evaluation. She states that she had an episode of back discomfort at her bellybutton. She said it was hard and firm. She rated it as an 8 out of 10. The next day when she was able to see her PCP it was improved. Since then it has not been as uncomfortable. She states that she has had an outtie bellybutton but it is gotten more prominent over the years. No nausea or vomiting. Bowel movements are generally at least every other day. No prior abdominal surgery. She also reports an episode of pain in her left upper abdomen after eating.  She states that she is continuing to get medical treatment for management of her sleep apnea. She had OMFS surgery and attempts to correct her severe sleep apnea. She states that only improved by about 50% but she still has issues with sleep apnea. She also has issues with numbness around her central face as a result of the surgery she states.   Past Medical History:  Diagnosis Date   Abdominal pain    Anxiety    Blood in urine    Chills    Depression    Difficulty urinating    Dizziness    GERD (gastroesophageal reflux disease)    Hyperlipidemia    Night sweats    Pain    rectal   Poor appetite    Poor circulation    Rectal bleeding    Sleep apnea    Sleep difficulties    Tinnitus    Tremors of nervous system     Past Surgical  History:  Procedure Laterality Date   BIOPSY  11/28/2020   Procedure: BIOPSY;  Surgeon: Ronnette Juniper, MD;  Location: [facility];  Service: Gastroenterology;;  EGD and COLON   COLONOSCOPY WITH PROPOFOL N/A 11/28/2020   Procedure: COLONOSCOPY WITH PROPOFOL;  Surgeon: Ronnette Juniper, MD;  Location: [facility];  Service: Gastroenterology;  Laterality: N/A;   ECTROPION REPAIR Bilateral 05/04/2016   Procedure: REPAIR OF ECTROPION WITH CONJUNCTIVOPLASTY;  Surgeon: Karle Starch, MD;  Location: [place];  Service: Ophthalmology;  Laterality: Bilateral;   ESOPHAGOGASTRODUODENOSCOPY (EGD) WITH PROPOFOL N/A 11/28/2020   Procedure: ESOPHAGOGASTRODUODENOSCOPY (EGD) WITH PROPOFOL;  Surgeon: Ronnette Juniper, MD;  Location: [facility];  Service: Gastroenterology;  Laterality: N/A;   EYE SURGERY     MAXILLARY SINUS LIFT  06/08/2019   maxilofacial surgery   MOUTH SURGERY  around the age of 62   wisdom teeth    Family History  Problem Relation Age of Onset   Diabetes Mother    Diabetes Father    Heart disease Father     Social:  reports that she has never smoked. She has never used smokeless tobacco. She reports current alcohol use. She reports that she does not use drugs.  Allergies:  Allergies  Allergen Reactions   Drug Ingredient [Monosodium Glutamate] Other (See Comments)    (MSG) UPSET STOMACH   Other     artificial sweeteners    Medications: I have reviewed the patient's current medications.   ROS - all of the below systems have been reviewed with the patient and positives are indicated with bold text General: chills, fever or night sweats Eyes: blurry vision or double vision ENT: epistaxis or sore throat Allergy/Immunology: itchy/watery eyes or nasal congestion Hematologic/Lymphatic: bleeding problems, blood clots or swollen lymph nodes Endocrine: temperature intolerance or unexpected weight changes Breast: new or changing breast lumps or nipple discharge Resp: cough,  shortness of breath, or wheezing CV: chest pain or dyspnea on exertion GI: as per HPI GU: dysuria, trouble voiding, or hematuria MSK: joint pain or joint stiffness Neuro: TIA or stroke symptoms Derm: pruritus and skin lesion changes Psych: anxiety and depression  PE Blood pressure 126/77, pulse 73, temperature 98 F (36.7 C), temperature source Oral, resp. rate 16, height 5' 6.25" (1.683 m), weight 87.1 kg, SpO2 97 %. Constitutional: NAD; conversant; no deformities Eyes: Moist conjunctiva; no lid lag; anicteric; PERRL Neck: Trachea midline; no thyromegaly Lungs: Normal respiratory effort; no tactile fremitus CV: RRR; no palpable thrills; no pitting edema GI: Abd soft, nt, nd, +bulge at umbilicus; no palpable hepatosplenomegaly MSK: Normal gait; no clubbing/cyanosis Psychiatric: Appropriate affect; alert and oriented x3 Lymphatic: No palpable cervical or axillary lymphadenopathy Skin:no rash/lesions/jaundice  Results for orders placed or performed during the hospital encounter of 10/14/22 (from the past 48 hour(s))  Pregnancy, urine POC     Status: None   Collection Time: 10/14/22  7:47 AM  Result Value Ref Range   Preg Test, Ur NEGATIVE NEGATIVE    Comment:        THE SENSITIVITY OF THIS METHODOLOGY IS >24 mIU/mL     No results found.  Imaging: N/a  A/P: Sandra Kennedy is an 57 y.o. female with umbilical hernia To OR for open repair of umbilical hernia with poss mesh IV abx Eras  All questions asked and answered  Leighton Ruff. Redmond Pulling, MD, FACS General, Bariatric, & Minimally Invasive Surgery [place]

## 2022-10-14 NOTE — Anesthesia Procedure Notes (Signed)
Procedure Name: Intubation Date/Time: 10/14/2022 9:10 AM  Performed by: Lavina Hamman, CRNAPre-anesthesia Checklist: Patient identified, Emergency Drugs available, Suction available, Patient being monitored and Timeout performed Patient Re-evaluated:Patient Re-evaluated prior to induction Oxygen Delivery Method: Circle system utilized Preoxygenation: Pre-oxygenation with 100% oxygen Induction Type: IV induction Ventilation: Mask ventilation without difficulty LMA: LMA inserted LMA Size: 4.0 Number of attempts: 1 Placement Confirmation: positive ETCO2, CO2 detector and breath sounds checked- equal and bilateral Tube secured with: Tape Dental Injury: Teeth and Oropharynx as per pre-operative assessment  Comments: AT LMA

## 2022-10-14 NOTE — Transfer of Care (Signed)
Immediate Anesthesia Transfer of Care Note  Patient: Sandra Kennedy  Procedure(s) Performed: Procedure(s): OPEN REPAIR INCARCERATED UMBILICAL HERNIA (N/A)  Patient Location: PACU  Anesthesia Type:General  Level of Consciousness:  sedated, patient cooperative and responds to stimulation  Airway & Oxygen Therapy:Patient Spontanous Breathing and Patient connected to face mask oxgen  Post-op Assessment:  Report given to PACU RN and Post -op Vital signs reviewed and stable  Post vital signs:  Reviewed and stable  Last Vitals:  Vitals:   10/14/22 0741  BP: 126/77  Pulse: 73  Resp: 16  Temp: 36.7 C  SpO2: 81%    Complications: No apparent anesthesia complications

## 2022-10-14 NOTE — Anesthesia Postprocedure Evaluation (Signed)
Anesthesia Post Note  Patient: Sandra Kennedy  Procedure(s) Performed: OPEN REPAIR INCARCERATED UMBILICAL HERNIA     Patient location during evaluation: PACU Anesthesia Type: General Level of consciousness: sedated Pain management: pain level controlled Vital Signs Assessment: post-procedure vital signs reviewed and stable Respiratory status: spontaneous breathing and respiratory function stable Cardiovascular status: stable Postop Assessment: no apparent nausea or vomiting Anesthetic complications: no  No notable events documented.  Last Vitals:  Vitals:   10/14/22 1030 10/14/22 1056  BP: 131/81 (!) 148/97  Pulse: 84 80  Resp: 18 12  Temp:  (!) 36.4 C  SpO2: 100% 98%    Last Pain:  Vitals:   10/14/22 1056  TempSrc: Oral  PainSc: 0-No pain                 [name]

## 2022-10-15 ENCOUNTER — Encounter (HOSPITAL_COMMUNITY): Payer: Self-pay | Admitting: General Surgery

## 2022-11-03 DIAGNOSIS — K420 Umbilical hernia with obstruction, without gangrene: Secondary | ICD-10-CM | POA: Diagnosis not present

## 2022-11-03 DIAGNOSIS — Z8719 Personal history of other diseases of the digestive system: Secondary | ICD-10-CM | POA: Diagnosis not present

## 2022-11-03 DIAGNOSIS — Z9889 Other specified postprocedural states: Secondary | ICD-10-CM | POA: Diagnosis not present

## 2022-11-04 DIAGNOSIS — E7800 Pure hypercholesterolemia, unspecified: Secondary | ICD-10-CM | POA: Diagnosis not present

## 2022-11-04 DIAGNOSIS — F419 Anxiety disorder, unspecified: Secondary | ICD-10-CM | POA: Diagnosis not present

## 2022-11-04 DIAGNOSIS — R7303 Prediabetes: Secondary | ICD-10-CM | POA: Diagnosis not present

## 2022-11-04 DIAGNOSIS — G4733 Obstructive sleep apnea (adult) (pediatric): Secondary | ICD-10-CM | POA: Diagnosis not present

## 2022-11-04 DIAGNOSIS — Z23 Encounter for immunization: Secondary | ICD-10-CM | POA: Diagnosis not present

## 2023-01-19 DIAGNOSIS — H903 Sensorineural hearing loss, bilateral: Secondary | ICD-10-CM | POA: Diagnosis not present

## 2023-04-12 DIAGNOSIS — Z13220 Encounter for screening for lipoid disorders: Secondary | ICD-10-CM | POA: Diagnosis not present

## 2023-04-12 DIAGNOSIS — Z1329 Encounter for screening for other suspected endocrine disorder: Secondary | ICD-10-CM | POA: Diagnosis not present

## 2023-04-12 DIAGNOSIS — Z1151 Encounter for screening for human papillomavirus (HPV): Secondary | ICD-10-CM | POA: Diagnosis not present

## 2023-04-12 DIAGNOSIS — Z13228 Encounter for screening for other metabolic disorders: Secondary | ICD-10-CM | POA: Diagnosis not present

## 2023-04-12 DIAGNOSIS — Z1231 Encounter for screening mammogram for malignant neoplasm of breast: Secondary | ICD-10-CM | POA: Diagnosis not present

## 2023-04-12 DIAGNOSIS — Z124 Encounter for screening for malignant neoplasm of cervix: Secondary | ICD-10-CM | POA: Diagnosis not present

## 2023-04-12 DIAGNOSIS — Z131 Encounter for screening for diabetes mellitus: Secondary | ICD-10-CM | POA: Diagnosis not present

## 2023-04-12 DIAGNOSIS — Z6826 Body mass index (BMI) 26.0-26.9, adult: Secondary | ICD-10-CM | POA: Diagnosis not present

## 2023-04-12 DIAGNOSIS — Z01419 Encounter for gynecological examination (general) (routine) without abnormal findings: Secondary | ICD-10-CM | POA: Diagnosis not present

## 2023-04-12 DIAGNOSIS — Z1321 Encounter for screening for nutritional disorder: Secondary | ICD-10-CM | POA: Diagnosis not present

## 2023-04-13 ENCOUNTER — Other Ambulatory Visit: Payer: Self-pay | Admitting: Obstetrics and Gynecology

## 2023-04-13 DIAGNOSIS — Z8249 Family history of ischemic heart disease and other diseases of the circulatory system: Secondary | ICD-10-CM

## 2023-05-13 ENCOUNTER — Other Ambulatory Visit: Payer: BC Managed Care – PPO

## 2023-05-31 DIAGNOSIS — R7303 Prediabetes: Secondary | ICD-10-CM | POA: Diagnosis not present

## 2023-05-31 DIAGNOSIS — E7800 Pure hypercholesterolemia, unspecified: Secondary | ICD-10-CM | POA: Diagnosis not present

## 2023-05-31 DIAGNOSIS — F419 Anxiety disorder, unspecified: Secondary | ICD-10-CM | POA: Diagnosis not present

## 2023-05-31 DIAGNOSIS — G4733 Obstructive sleep apnea (adult) (pediatric): Secondary | ICD-10-CM | POA: Diagnosis not present

## 2023-06-21 DIAGNOSIS — Z30432 Encounter for removal of intrauterine contraceptive device: Secondary | ICD-10-CM | POA: Diagnosis not present

## 2023-06-21 DIAGNOSIS — E559 Vitamin D deficiency, unspecified: Secondary | ICD-10-CM | POA: Diagnosis not present

## 2023-06-21 DIAGNOSIS — R7989 Other specified abnormal findings of blood chemistry: Secondary | ICD-10-CM | POA: Diagnosis not present

## 2023-09-29 DIAGNOSIS — R7989 Other specified abnormal findings of blood chemistry: Secondary | ICD-10-CM | POA: Diagnosis not present

## 2023-11-03 DIAGNOSIS — Z13820 Encounter for screening for osteoporosis: Secondary | ICD-10-CM | POA: Diagnosis not present

## 2023-11-03 DIAGNOSIS — E559 Vitamin D deficiency, unspecified: Secondary | ICD-10-CM | POA: Diagnosis not present

## 2024-01-13 ENCOUNTER — Emergency Department (HOSPITAL_BASED_OUTPATIENT_CLINIC_OR_DEPARTMENT_OTHER)

## 2024-01-13 ENCOUNTER — Emergency Department (HOSPITAL_BASED_OUTPATIENT_CLINIC_OR_DEPARTMENT_OTHER)
Admission: EM | Admit: 2024-01-13 | Discharge: 2024-01-14 | Disposition: A | Discharge status: Home / Self Care | Attending: Emergency Medicine | Admitting: Emergency Medicine

## 2024-01-13 ENCOUNTER — Encounter (HOSPITAL_BASED_OUTPATIENT_CLINIC_OR_DEPARTMENT_OTHER): Payer: Self-pay | Admitting: Urology

## 2024-01-13 ENCOUNTER — Other Ambulatory Visit: Payer: Self-pay

## 2024-01-13 DIAGNOSIS — Y92410 Unspecified street and highway as the place of occurrence of the external cause: Secondary | ICD-10-CM | POA: Insufficient documentation

## 2024-01-13 DIAGNOSIS — S80211A Abrasion, right knee, initial encounter: Secondary | ICD-10-CM | POA: Diagnosis not present

## 2024-01-13 DIAGNOSIS — M25551 Pain in right hip: Secondary | ICD-10-CM | POA: Diagnosis not present

## 2024-01-13 DIAGNOSIS — S0081XA Abrasion of other part of head, initial encounter: Secondary | ICD-10-CM | POA: Insufficient documentation

## 2024-01-13 DIAGNOSIS — R6884 Jaw pain: Secondary | ICD-10-CM

## 2024-01-13 DIAGNOSIS — S80212A Abrasion, left knee, initial encounter: Secondary | ICD-10-CM | POA: Diagnosis not present

## 2024-01-13 DIAGNOSIS — Z23 Encounter for immunization: Secondary | ICD-10-CM | POA: Diagnosis not present

## 2024-01-13 DIAGNOSIS — J320 Chronic maxillary sinusitis: Secondary | ICD-10-CM | POA: Diagnosis not present

## 2024-01-13 DIAGNOSIS — S31119A Laceration without foreign body of abdominal wall, unspecified quadrant without penetration into peritoneal cavity, initial encounter: Secondary | ICD-10-CM | POA: Insufficient documentation

## 2024-01-13 DIAGNOSIS — S79911A Unspecified injury of right hip, initial encounter: Secondary | ICD-10-CM | POA: Diagnosis not present

## 2024-01-13 MED ORDER — BACITRACIN ZINC 500 UNIT/GM EX OINT
TOPICAL_OINTMENT | Freq: Two times a day (BID) | CUTANEOUS | Status: DC
  Administered 2024-01-13: 1 via TOPICAL
  Filled 2024-01-13: qty 28.35

## 2024-01-13 MED ORDER — OXYCODONE-ACETAMINOPHEN 5-325 MG PO TABS
1.0000 | ORAL_TABLET | Freq: Once | ORAL | Status: AC
  Administered 2024-01-13: 1 via ORAL
  Filled 2024-01-13: qty 1

## 2024-01-13 MED ORDER — TETANUS-DIPHTH-ACELL PERTUSSIS 5-2.5-18.5 LF-MCG/0.5 IM SUSY
0.5000 mL | PREFILLED_SYRINGE | Freq: Once | INTRAMUSCULAR | Status: AC
  Administered 2024-01-13: 0.5 mL via INTRAMUSCULAR
  Filled 2024-01-13: qty 0.5

## 2024-01-13 MED ORDER — IBUPROFEN 600 MG PO TABS: 600.0000 mg | ORAL_TABLET | Freq: Four times a day (QID) | ORAL | 0 refills | Status: AC | PRN

## 2024-01-13 NOTE — ED Notes (Signed)
 Wound care provided for facial abrasions using wound cleanser spray, saline soaked gauze.  Left open to air, bacitracin  applied.
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
## 2024-01-13 NOTE — ED Notes (Signed)
 Pt transported to imaging.

## 2024-01-13 NOTE — Discharge Instructions (Addendum)
 Take acetaminophen  and/or ibuprofen as needed for pain.  Please be aware that if you combine acetaminophen  and ibuprofen, you will get better pain relief and you get from taking either medication by itself.
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
## 2024-01-13 NOTE — ED Notes (Signed)
 ED Provider at bedside performing laceration repair at this time.

## 2024-01-13 NOTE — ED Notes (Signed)
 Wound care provided with wound cleansing spray to laceration to suprapubic area.  Wound dressed with saline soaked gauze, abd pad.

## 2024-01-19 DIAGNOSIS — E7800 Pure hypercholesterolemia, unspecified: Secondary | ICD-10-CM | POA: Diagnosis not present

## 2024-01-19 DIAGNOSIS — S31119D Laceration without foreign body of abdominal wall, unspecified quadrant without penetration into peritoneal cavity, subsequent encounter: Secondary | ICD-10-CM | POA: Diagnosis not present

## 2024-01-19 DIAGNOSIS — R7303 Prediabetes: Secondary | ICD-10-CM | POA: Diagnosis not present

## 2024-01-19 DIAGNOSIS — R519 Headache, unspecified: Secondary | ICD-10-CM | POA: Diagnosis not present

## 2024-01-26 DIAGNOSIS — D485 Neoplasm of uncertain behavior of skin: Secondary | ICD-10-CM | POA: Diagnosis not present

## 2024-01-26 DIAGNOSIS — D045 Carcinoma in situ of skin of trunk: Secondary | ICD-10-CM | POA: Diagnosis not present

## 2024-01-26 DIAGNOSIS — L814 Other melanin hyperpigmentation: Secondary | ICD-10-CM | POA: Diagnosis not present

## 2024-01-26 DIAGNOSIS — L821 Other seborrheic keratosis: Secondary | ICD-10-CM | POA: Diagnosis not present

## 2024-01-26 DIAGNOSIS — L578 Other skin changes due to chronic exposure to nonionizing radiation: Secondary | ICD-10-CM | POA: Diagnosis not present

## 2024-01-26 DIAGNOSIS — D229 Melanocytic nevi, unspecified: Secondary | ICD-10-CM | POA: Diagnosis not present

## 2024-02-29 DIAGNOSIS — D045 Carcinoma in situ of skin of trunk: Secondary | ICD-10-CM | POA: Diagnosis not present

## 2024-03-28 DIAGNOSIS — N1831 Chronic kidney disease, stage 3a: Secondary | ICD-10-CM | POA: Diagnosis not present

## 2024-03-28 DIAGNOSIS — E7800 Pure hypercholesterolemia, unspecified: Secondary | ICD-10-CM | POA: Diagnosis not present

## 2024-03-28 DIAGNOSIS — R7303 Prediabetes: Secondary | ICD-10-CM | POA: Diagnosis not present

## 2024-04-23 ENCOUNTER — Other Ambulatory Visit: Payer: Self-pay | Admitting: Obstetrics and Gynecology

## 2024-04-23 DIAGNOSIS — Z6825 Body mass index (BMI) 25.0-25.9, adult: Secondary | ICD-10-CM | POA: Diagnosis not present

## 2024-04-23 DIAGNOSIS — Z01419 Encounter for gynecological examination (general) (routine) without abnormal findings: Secondary | ICD-10-CM | POA: Diagnosis not present

## 2024-04-23 DIAGNOSIS — Z1231 Encounter for screening mammogram for malignant neoplasm of breast: Secondary | ICD-10-CM | POA: Diagnosis not present

## 2024-04-23 DIAGNOSIS — R87610 Atypical squamous cells of undetermined significance on cytologic smear of cervix (ASC-US): Secondary | ICD-10-CM | POA: Diagnosis not present

## 2024-04-24 ENCOUNTER — Other Ambulatory Visit: Payer: Self-pay | Admitting: Obstetrics and Gynecology

## 2024-04-24 DIAGNOSIS — Z8249 Family history of ischemic heart disease and other diseases of the circulatory system: Secondary | ICD-10-CM

## 2024-06-12 DIAGNOSIS — R319 Hematuria, unspecified: Secondary | ICD-10-CM | POA: Diagnosis not present

## 2024-07-23 DIAGNOSIS — H903 Sensorineural hearing loss, bilateral: Secondary | ICD-10-CM | POA: Diagnosis not present

## 2024-07-23 DIAGNOSIS — H9313 Tinnitus, bilateral: Secondary | ICD-10-CM | POA: Diagnosis not present
